# Patient Record
Sex: Female | Born: 1945
Health system: Southern US, Community
[De-identification: ages and names within clinical notes are randomized; demographics above are authoritative.]

## PROBLEM LIST (undated history)

## (undated) HISTORY — PX: CHOLECYSTECTOMY: SHX55

---

## 2007-09-25 DIAGNOSIS — C4491 Basal cell carcinoma of skin, unspecified: Secondary | ICD-10-CM

## 2007-09-25 HISTORY — DX: Basal cell carcinoma of skin, unspecified: C44.91

## 2009-04-19 ENCOUNTER — Emergency Department: Payer: Self-pay | Admitting: Emergency Medicine

## 2009-09-01 ENCOUNTER — Emergency Department: Payer: Self-pay | Admitting: Emergency Medicine

## 2013-08-05 DIAGNOSIS — K5730 Diverticulosis of large intestine without perforation or abscess without bleeding: Secondary | ICD-10-CM | POA: Diagnosis not present

## 2013-08-05 DIAGNOSIS — K648 Other hemorrhoids: Secondary | ICD-10-CM | POA: Diagnosis not present

## 2013-08-05 DIAGNOSIS — K644 Residual hemorrhoidal skin tags: Secondary | ICD-10-CM | POA: Diagnosis not present

## 2013-08-05 DIAGNOSIS — K6389 Other specified diseases of intestine: Secondary | ICD-10-CM | POA: Diagnosis not present

## 2013-08-05 DIAGNOSIS — E785 Hyperlipidemia, unspecified: Secondary | ICD-10-CM | POA: Diagnosis not present

## 2013-08-05 DIAGNOSIS — Z1211 Encounter for screening for malignant neoplasm of colon: Secondary | ICD-10-CM | POA: Diagnosis not present

## 2013-09-02 DIAGNOSIS — L919 Hypertrophic disorder of the skin, unspecified: Secondary | ICD-10-CM | POA: Diagnosis not present

## 2013-09-02 DIAGNOSIS — L578 Other skin changes due to chronic exposure to nonionizing radiation: Secondary | ICD-10-CM | POA: Diagnosis not present

## 2013-09-02 DIAGNOSIS — L909 Atrophic disorder of skin, unspecified: Secondary | ICD-10-CM | POA: Diagnosis not present

## 2013-09-02 DIAGNOSIS — Z85828 Personal history of other malignant neoplasm of skin: Secondary | ICD-10-CM | POA: Diagnosis not present

## 2013-09-02 DIAGNOSIS — L821 Other seborrheic keratosis: Secondary | ICD-10-CM | POA: Diagnosis not present

## 2013-09-02 DIAGNOSIS — L820 Inflamed seborrheic keratosis: Secondary | ICD-10-CM | POA: Diagnosis not present

## 2013-09-15 DIAGNOSIS — H2510 Age-related nuclear cataract, unspecified eye: Secondary | ICD-10-CM | POA: Diagnosis not present

## 2013-10-09 DIAGNOSIS — H6120 Impacted cerumen, unspecified ear: Secondary | ICD-10-CM | POA: Diagnosis not present

## 2013-12-11 ENCOUNTER — Ambulatory Visit: Payer: Self-pay

## 2013-12-11 DIAGNOSIS — Z7982 Long term (current) use of aspirin: Secondary | ICD-10-CM | POA: Diagnosis not present

## 2013-12-11 DIAGNOSIS — S8263XA Displaced fracture of lateral malleolus of unspecified fibula, initial encounter for closed fracture: Secondary | ICD-10-CM | POA: Diagnosis not present

## 2013-12-11 DIAGNOSIS — S82843A Displaced bimalleolar fracture of unspecified lower leg, initial encounter for closed fracture: Secondary | ICD-10-CM | POA: Diagnosis not present

## 2013-12-11 DIAGNOSIS — E7800 Pure hypercholesterolemia, unspecified: Secondary | ICD-10-CM | POA: Diagnosis not present

## 2013-12-12 DIAGNOSIS — X500XXA Overexertion from strenuous movement or load, initial encounter: Secondary | ICD-10-CM | POA: Diagnosis not present

## 2013-12-12 DIAGNOSIS — X503XXA Overexertion from repetitive movements, initial encounter: Secondary | ICD-10-CM | POA: Diagnosis not present

## 2013-12-12 DIAGNOSIS — S8263XA Displaced fracture of lateral malleolus of unspecified fibula, initial encounter for closed fracture: Secondary | ICD-10-CM | POA: Diagnosis not present

## 2014-01-02 DIAGNOSIS — S82899A Other fracture of unspecified lower leg, initial encounter for closed fracture: Secondary | ICD-10-CM | POA: Diagnosis not present

## 2014-01-12 DIAGNOSIS — S8263XA Displaced fracture of lateral malleolus of unspecified fibula, initial encounter for closed fracture: Secondary | ICD-10-CM | POA: Diagnosis not present

## 2014-01-13 DIAGNOSIS — S8263XA Displaced fracture of lateral malleolus of unspecified fibula, initial encounter for closed fracture: Secondary | ICD-10-CM | POA: Diagnosis not present

## 2014-01-16 DIAGNOSIS — S8263XA Displaced fracture of lateral malleolus of unspecified fibula, initial encounter for closed fracture: Secondary | ICD-10-CM | POA: Diagnosis not present

## 2014-01-19 DIAGNOSIS — S8263XA Displaced fracture of lateral malleolus of unspecified fibula, initial encounter for closed fracture: Secondary | ICD-10-CM | POA: Diagnosis not present

## 2014-01-22 DIAGNOSIS — S8263XA Displaced fracture of lateral malleolus of unspecified fibula, initial encounter for closed fracture: Secondary | ICD-10-CM | POA: Diagnosis not present

## 2014-01-26 DIAGNOSIS — S8263XA Displaced fracture of lateral malleolus of unspecified fibula, initial encounter for closed fracture: Secondary | ICD-10-CM | POA: Diagnosis not present

## 2014-01-28 DIAGNOSIS — S8263XA Displaced fracture of lateral malleolus of unspecified fibula, initial encounter for closed fracture: Secondary | ICD-10-CM | POA: Diagnosis not present

## 2014-01-29 DIAGNOSIS — M25579 Pain in unspecified ankle and joints of unspecified foot: Secondary | ICD-10-CM | POA: Diagnosis not present

## 2014-02-02 DIAGNOSIS — S8263XA Displaced fracture of lateral malleolus of unspecified fibula, initial encounter for closed fracture: Secondary | ICD-10-CM | POA: Diagnosis not present

## 2014-02-04 DIAGNOSIS — S8263XA Displaced fracture of lateral malleolus of unspecified fibula, initial encounter for closed fracture: Secondary | ICD-10-CM | POA: Diagnosis not present

## 2014-02-06 DIAGNOSIS — S8263XA Displaced fracture of lateral malleolus of unspecified fibula, initial encounter for closed fracture: Secondary | ICD-10-CM | POA: Diagnosis not present

## 2014-02-12 DIAGNOSIS — S8263XA Displaced fracture of lateral malleolus of unspecified fibula, initial encounter for closed fracture: Secondary | ICD-10-CM | POA: Diagnosis not present

## 2014-02-13 DIAGNOSIS — S8263XA Displaced fracture of lateral malleolus of unspecified fibula, initial encounter for closed fracture: Secondary | ICD-10-CM | POA: Diagnosis not present

## 2014-02-21 DIAGNOSIS — Z1231 Encounter for screening mammogram for malignant neoplasm of breast: Secondary | ICD-10-CM | POA: Diagnosis not present

## 2014-03-24 DIAGNOSIS — Z1239 Encounter for other screening for malignant neoplasm of breast: Secondary | ICD-10-CM | POA: Diagnosis not present

## 2014-03-24 DIAGNOSIS — N952 Postmenopausal atrophic vaginitis: Secondary | ICD-10-CM | POA: Diagnosis not present

## 2014-03-24 DIAGNOSIS — N368 Other specified disorders of urethra: Secondary | ICD-10-CM | POA: Diagnosis not present

## 2014-04-20 DIAGNOSIS — H93299 Other abnormal auditory perceptions, unspecified ear: Secondary | ICD-10-CM | POA: Diagnosis not present

## 2014-04-20 DIAGNOSIS — H6120 Impacted cerumen, unspecified ear: Secondary | ICD-10-CM | POA: Diagnosis not present

## 2014-04-20 DIAGNOSIS — E8881 Metabolic syndrome: Secondary | ICD-10-CM | POA: Diagnosis not present

## 2014-04-20 DIAGNOSIS — Z0000 Encounter for general adult medical examination without abnormal findings: Secondary | ICD-10-CM | POA: Diagnosis not present

## 2014-04-20 DIAGNOSIS — F411 Generalized anxiety disorder: Secondary | ICD-10-CM | POA: Diagnosis not present

## 2014-11-17 DIAGNOSIS — E8881 Metabolic syndrome: Secondary | ICD-10-CM | POA: Diagnosis not present

## 2014-11-17 DIAGNOSIS — I2510 Atherosclerotic heart disease of native coronary artery without angina pectoris: Secondary | ICD-10-CM | POA: Diagnosis not present

## 2014-11-17 DIAGNOSIS — F064 Anxiety disorder due to known physiological condition: Secondary | ICD-10-CM | POA: Diagnosis not present

## 2014-11-20 DIAGNOSIS — H6123 Impacted cerumen, bilateral: Secondary | ICD-10-CM | POA: Diagnosis not present

## 2014-11-20 DIAGNOSIS — H93299 Other abnormal auditory perceptions, unspecified ear: Secondary | ICD-10-CM | POA: Diagnosis not present

## 2014-11-20 DIAGNOSIS — J340 Abscess, furuncle and carbuncle of nose: Secondary | ICD-10-CM | POA: Diagnosis not present

## 2014-12-25 DIAGNOSIS — N649 Disorder of breast, unspecified: Secondary | ICD-10-CM | POA: Diagnosis not present

## 2014-12-25 DIAGNOSIS — N6032 Fibrosclerosis of left breast: Secondary | ICD-10-CM | POA: Diagnosis not present

## 2014-12-29 DIAGNOSIS — N63 Unspecified lump in breast: Secondary | ICD-10-CM | POA: Diagnosis not present

## 2015-03-20 DIAGNOSIS — Z1231 Encounter for screening mammogram for malignant neoplasm of breast: Secondary | ICD-10-CM | POA: Diagnosis not present

## 2015-05-27 DIAGNOSIS — I781 Nevus, non-neoplastic: Secondary | ICD-10-CM | POA: Diagnosis not present

## 2015-05-27 DIAGNOSIS — L918 Other hypertrophic disorders of the skin: Secondary | ICD-10-CM | POA: Diagnosis not present

## 2015-05-27 DIAGNOSIS — D485 Neoplasm of uncertain behavior of skin: Secondary | ICD-10-CM | POA: Diagnosis not present

## 2015-05-27 DIAGNOSIS — Z1283 Encounter for screening for malignant neoplasm of skin: Secondary | ICD-10-CM | POA: Diagnosis not present

## 2015-05-27 DIAGNOSIS — L821 Other seborrheic keratosis: Secondary | ICD-10-CM | POA: Diagnosis not present

## 2015-05-27 DIAGNOSIS — D1800 Hemangioma unspecified site: Secondary | ICD-10-CM | POA: Diagnosis not present

## 2015-05-27 DIAGNOSIS — L820 Inflamed seborrheic keratosis: Secondary | ICD-10-CM | POA: Diagnosis not present

## 2015-05-27 DIAGNOSIS — D229 Melanocytic nevi, unspecified: Secondary | ICD-10-CM | POA: Diagnosis not present

## 2015-05-27 DIAGNOSIS — Z85828 Personal history of other malignant neoplasm of skin: Secondary | ICD-10-CM | POA: Diagnosis not present

## 2015-05-28 DIAGNOSIS — H6123 Impacted cerumen, bilateral: Secondary | ICD-10-CM | POA: Diagnosis not present

## 2015-06-14 DIAGNOSIS — R079 Chest pain, unspecified: Secondary | ICD-10-CM | POA: Diagnosis not present

## 2015-06-14 DIAGNOSIS — F064 Anxiety disorder due to known physiological condition: Secondary | ICD-10-CM | POA: Diagnosis not present

## 2015-06-14 DIAGNOSIS — R42 Dizziness and giddiness: Secondary | ICD-10-CM | POA: Diagnosis not present

## 2015-08-26 IMAGING — CR DG ANKLE COMPLETE 3+V*L*
1 series · 3 of 3 positions shown · non-contrast
Comparison: None.

CLINICAL DATA: Fall, ankle twisting.

EXAM:
LEFT ANKLE COMPLETE - 3+ VIEW

[Series 1: ap · 0.17mm/px · 3 of 3 slices shown]
[im 1/3]
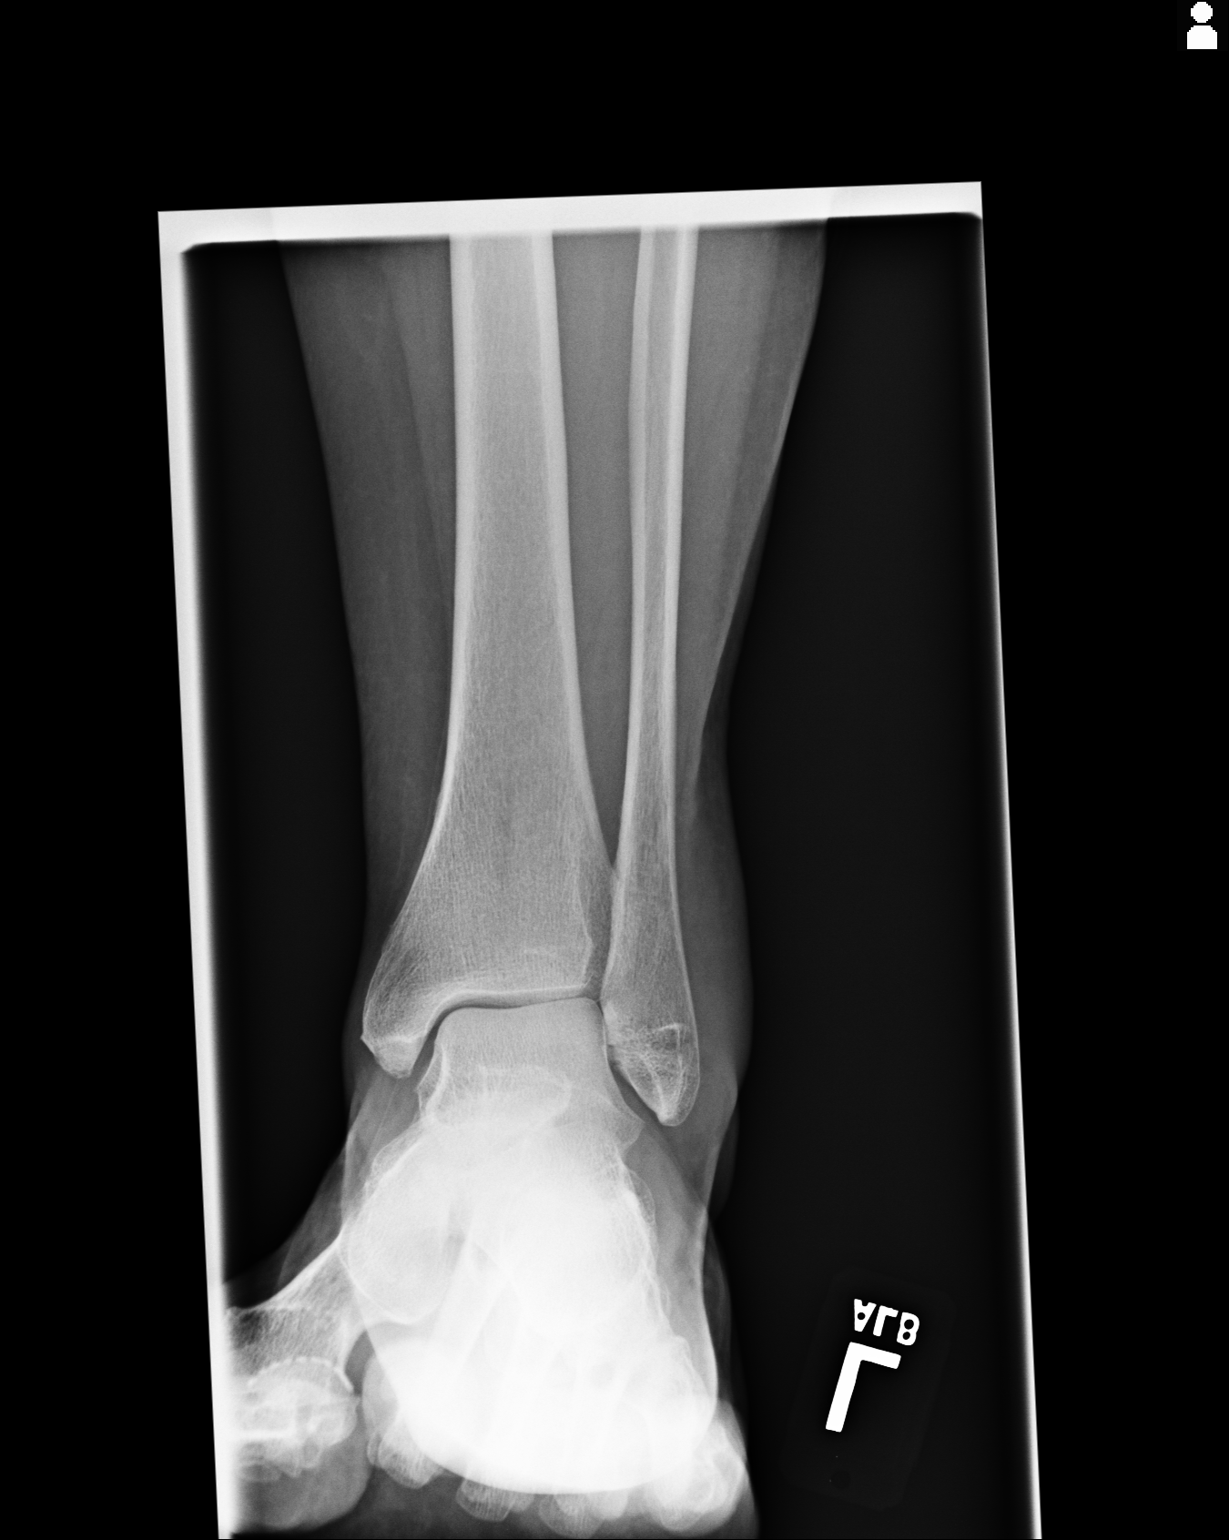
[im 2/3]
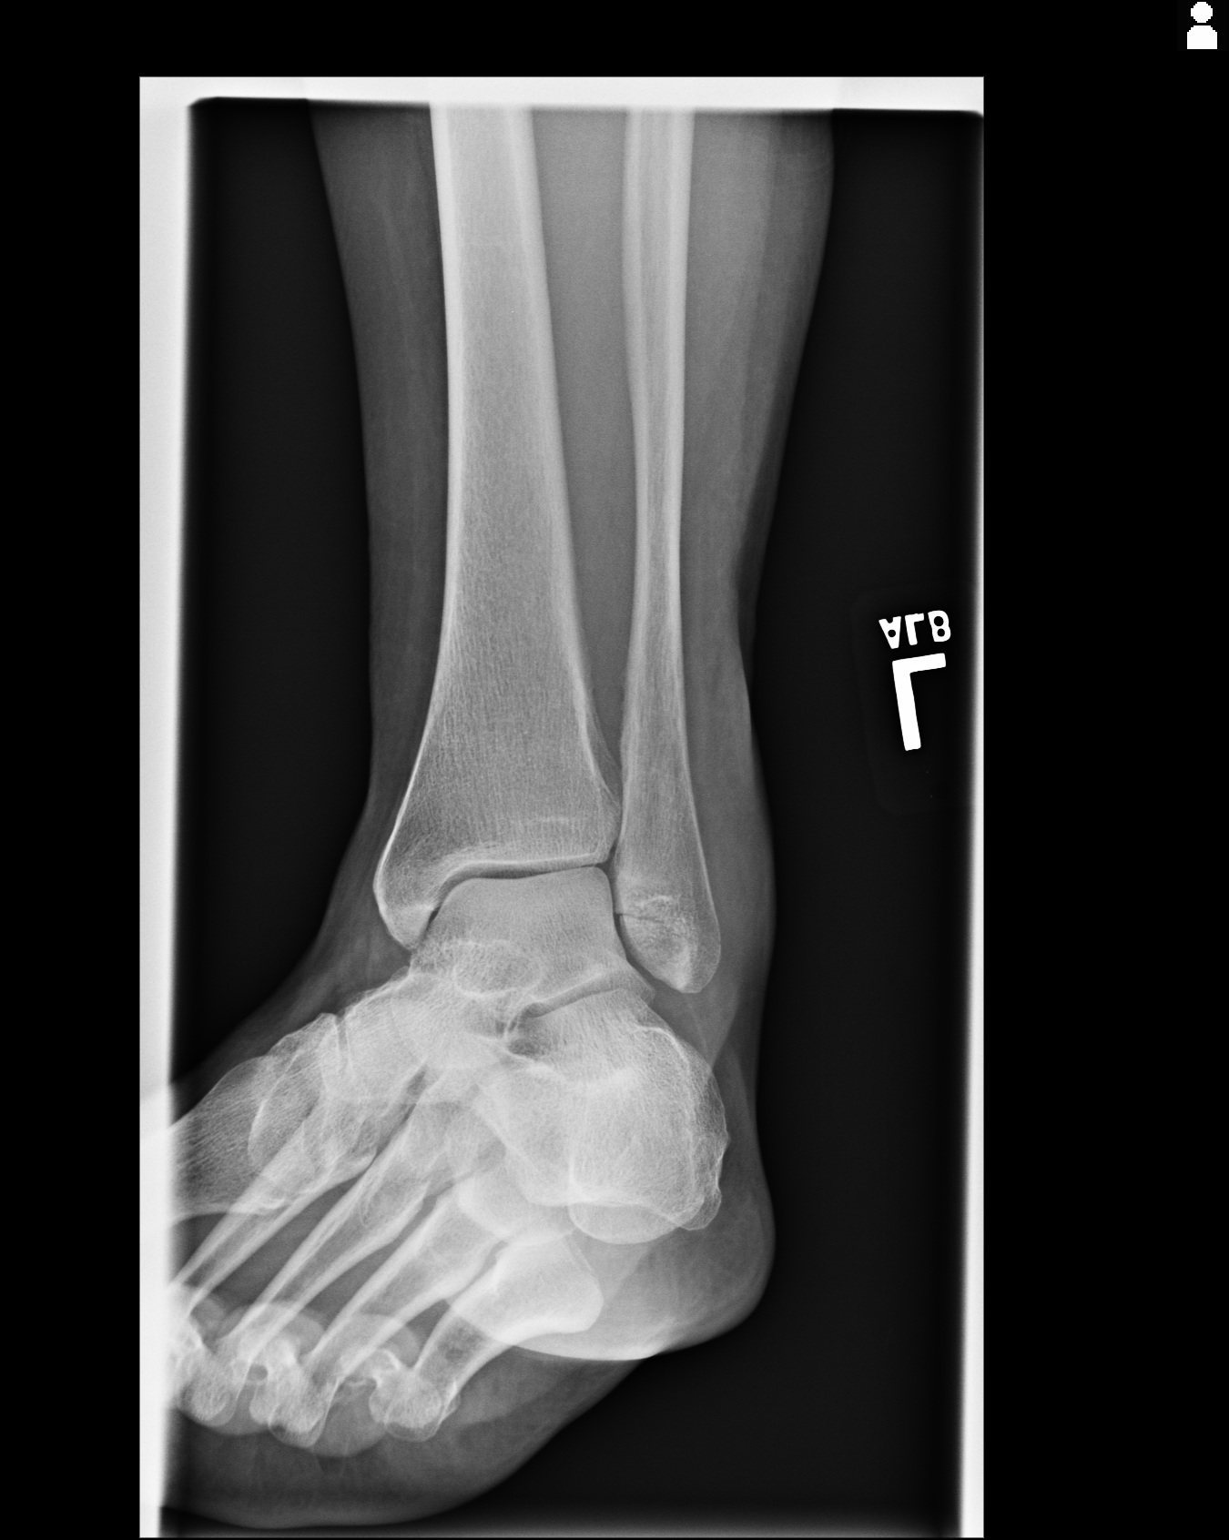
[im 3/3]
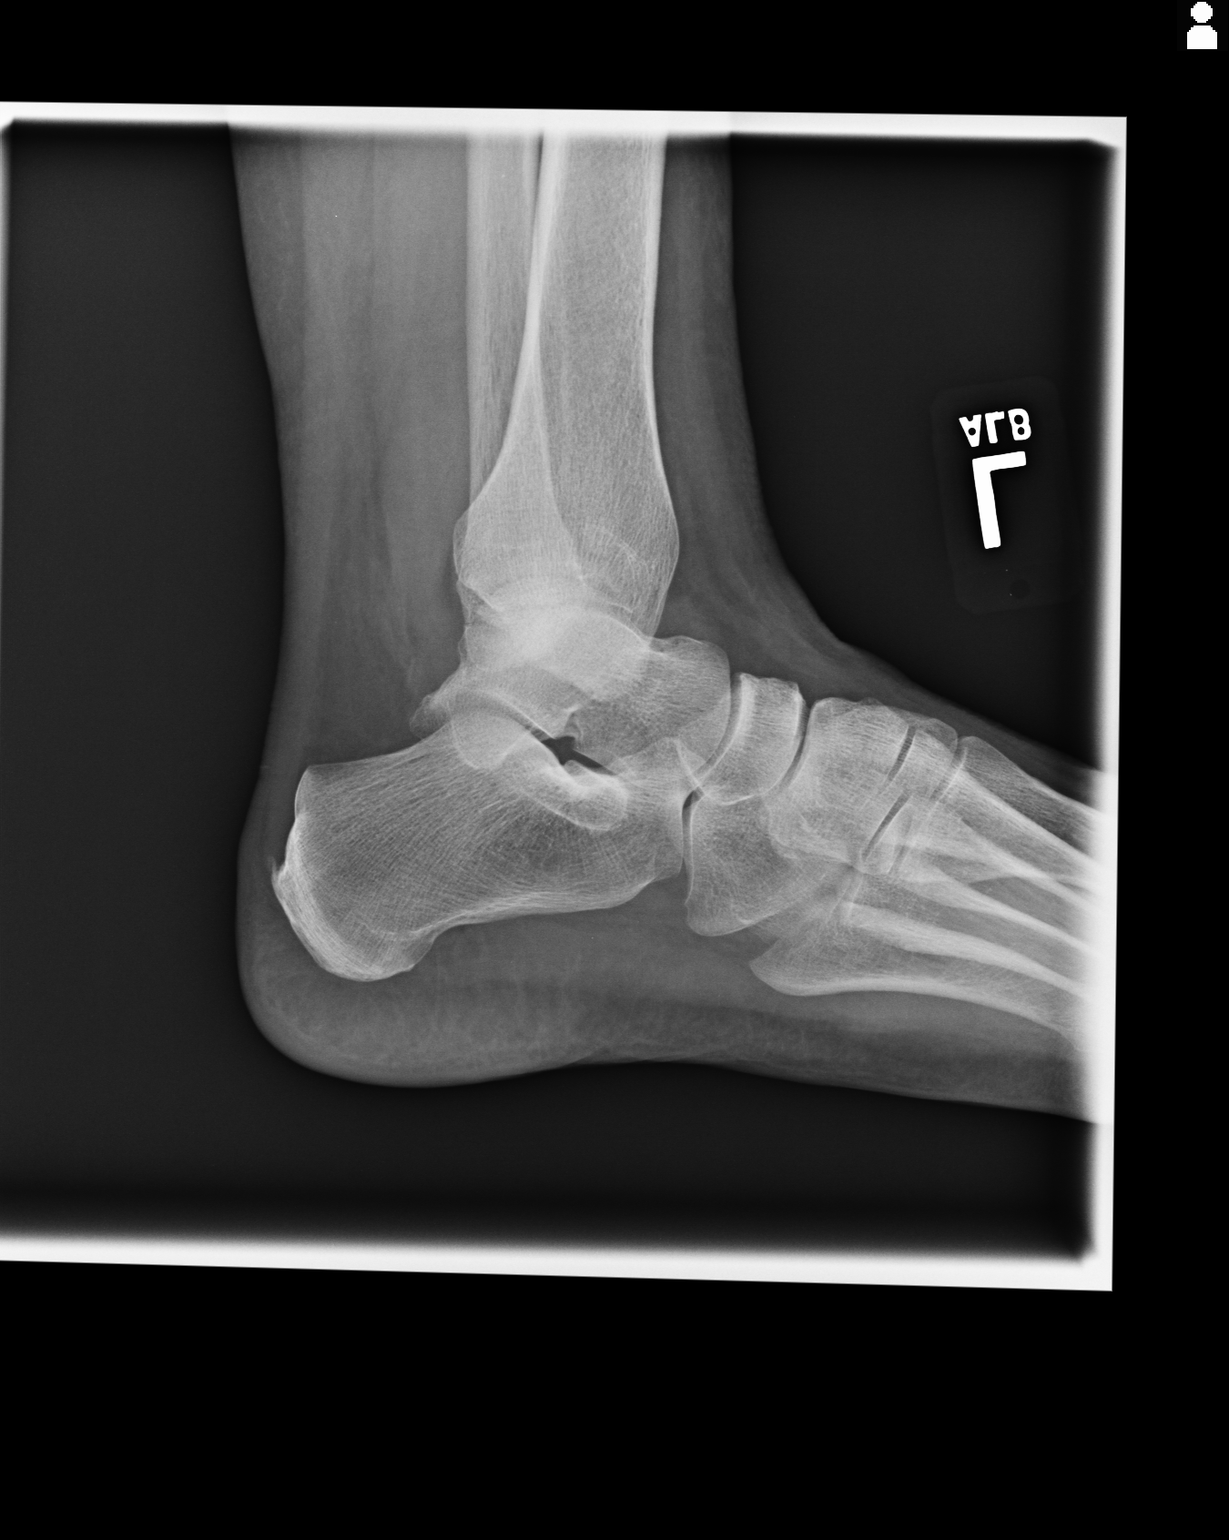

[3 of 3 positions shown; findings below may reference images not displayed]

FINDINGS: Nondisplaced lateral malleolus fracture without dislocation. Ankle
mortise appears congruent and tibial fibular syndesmosis intact. No
destructive bony lesions. Lateral ankle soft tissue swelling without
subcutaneous gas or radiopaque foreign bodies.
IMPRESSION: Nondisplaced lateral malleolus fracture without dislocation.

  By: Ferienhaus Erxleben

## 2015-11-10 DIAGNOSIS — L578 Other skin changes due to chronic exposure to nonionizing radiation: Secondary | ICD-10-CM | POA: Diagnosis not present

## 2015-11-10 DIAGNOSIS — L820 Inflamed seborrheic keratosis: Secondary | ICD-10-CM | POA: Diagnosis not present

## 2015-11-10 DIAGNOSIS — D1800 Hemangioma unspecified site: Secondary | ICD-10-CM | POA: Diagnosis not present

## 2015-11-10 DIAGNOSIS — L821 Other seborrheic keratosis: Secondary | ICD-10-CM | POA: Diagnosis not present

## 2015-11-10 DIAGNOSIS — T07 Unspecified multiple injuries: Secondary | ICD-10-CM | POA: Diagnosis not present

## 2015-12-01 DIAGNOSIS — J301 Allergic rhinitis due to pollen: Secondary | ICD-10-CM | POA: Diagnosis not present

## 2015-12-01 DIAGNOSIS — H6123 Impacted cerumen, bilateral: Secondary | ICD-10-CM | POA: Diagnosis not present

## 2015-12-14 DIAGNOSIS — E8881 Metabolic syndrome: Secondary | ICD-10-CM | POA: Diagnosis not present

## 2015-12-14 DIAGNOSIS — F064 Anxiety disorder due to known physiological condition: Secondary | ICD-10-CM | POA: Diagnosis not present

## 2015-12-23 DIAGNOSIS — D1800 Hemangioma unspecified site: Secondary | ICD-10-CM | POA: Diagnosis not present

## 2015-12-23 DIAGNOSIS — D229 Melanocytic nevi, unspecified: Secondary | ICD-10-CM | POA: Diagnosis not present

## 2015-12-23 DIAGNOSIS — L820 Inflamed seborrheic keratosis: Secondary | ICD-10-CM | POA: Diagnosis not present

## 2015-12-23 DIAGNOSIS — L981 Factitial dermatitis: Secondary | ICD-10-CM | POA: Diagnosis not present

## 2015-12-24 DIAGNOSIS — F064 Anxiety disorder due to known physiological condition: Secondary | ICD-10-CM | POA: Diagnosis not present

## 2015-12-24 DIAGNOSIS — E8881 Metabolic syndrome: Secondary | ICD-10-CM | POA: Diagnosis not present

## 2016-03-25 DIAGNOSIS — Z1231 Encounter for screening mammogram for malignant neoplasm of breast: Secondary | ICD-10-CM | POA: Diagnosis not present

## 2016-05-25 DIAGNOSIS — Z01419 Encounter for gynecological examination (general) (routine) without abnormal findings: Secondary | ICD-10-CM | POA: Diagnosis not present

## 2016-06-05 DIAGNOSIS — H6980 Other specified disorders of Eustachian tube, unspecified ear: Secondary | ICD-10-CM | POA: Diagnosis not present

## 2016-06-05 DIAGNOSIS — J301 Allergic rhinitis due to pollen: Secondary | ICD-10-CM | POA: Diagnosis not present

## 2016-09-19 DIAGNOSIS — E8881 Metabolic syndrome: Secondary | ICD-10-CM | POA: Diagnosis not present

## 2016-09-19 DIAGNOSIS — F064 Anxiety disorder due to known physiological condition: Secondary | ICD-10-CM | POA: Diagnosis not present

## 2016-12-04 DIAGNOSIS — H6123 Impacted cerumen, bilateral: Secondary | ICD-10-CM | POA: Diagnosis not present

## 2016-12-04 DIAGNOSIS — Z411 Encounter for cosmetic surgery: Secondary | ICD-10-CM | POA: Diagnosis not present

## 2016-12-04 DIAGNOSIS — H9193 Unspecified hearing loss, bilateral: Secondary | ICD-10-CM | POA: Diagnosis not present

## 2016-12-22 DIAGNOSIS — Z85828 Personal history of other malignant neoplasm of skin: Secondary | ICD-10-CM | POA: Diagnosis not present

## 2016-12-22 DIAGNOSIS — I781 Nevus, non-neoplastic: Secondary | ICD-10-CM | POA: Diagnosis not present

## 2016-12-22 DIAGNOSIS — D229 Melanocytic nevi, unspecified: Secondary | ICD-10-CM | POA: Diagnosis not present

## 2016-12-22 DIAGNOSIS — Z1283 Encounter for screening for malignant neoplasm of skin: Secondary | ICD-10-CM | POA: Diagnosis not present

## 2016-12-22 DIAGNOSIS — L821 Other seborrheic keratosis: Secondary | ICD-10-CM | POA: Diagnosis not present

## 2016-12-22 DIAGNOSIS — I8393 Asymptomatic varicose veins of bilateral lower extremities: Secondary | ICD-10-CM | POA: Diagnosis not present

## 2016-12-22 DIAGNOSIS — L812 Freckles: Secondary | ICD-10-CM | POA: Diagnosis not present

## 2016-12-22 DIAGNOSIS — D1800 Hemangioma unspecified site: Secondary | ICD-10-CM | POA: Diagnosis not present

## 2016-12-22 DIAGNOSIS — L578 Other skin changes due to chronic exposure to nonionizing radiation: Secondary | ICD-10-CM | POA: Diagnosis not present

## 2016-12-22 DIAGNOSIS — L820 Inflamed seborrheic keratosis: Secondary | ICD-10-CM | POA: Diagnosis not present

## 2017-04-07 DIAGNOSIS — Z1231 Encounter for screening mammogram for malignant neoplasm of breast: Secondary | ICD-10-CM | POA: Diagnosis not present

## 2017-04-09 DIAGNOSIS — H6122 Impacted cerumen, left ear: Secondary | ICD-10-CM | POA: Diagnosis not present

## 2017-05-16 DIAGNOSIS — E8881 Metabolic syndrome: Secondary | ICD-10-CM | POA: Diagnosis not present

## 2017-05-16 DIAGNOSIS — F064 Anxiety disorder due to known physiological condition: Secondary | ICD-10-CM | POA: Diagnosis not present

## 2017-05-16 DIAGNOSIS — E639 Nutritional deficiency, unspecified: Secondary | ICD-10-CM | POA: Diagnosis not present

## 2017-05-16 DIAGNOSIS — E889 Metabolic disorder, unspecified: Secondary | ICD-10-CM | POA: Diagnosis not present

## 2017-08-09 DIAGNOSIS — H6123 Impacted cerumen, bilateral: Secondary | ICD-10-CM | POA: Diagnosis not present

## 2017-11-06 DIAGNOSIS — K210 Gastro-esophageal reflux disease with esophagitis: Secondary | ICD-10-CM | POA: Diagnosis not present

## 2017-11-06 DIAGNOSIS — E8881 Metabolic syndrome: Secondary | ICD-10-CM | POA: Diagnosis not present

## 2017-11-06 DIAGNOSIS — F064 Anxiety disorder due to known physiological condition: Secondary | ICD-10-CM | POA: Diagnosis not present

## 2017-11-06 DIAGNOSIS — F419 Anxiety disorder, unspecified: Secondary | ICD-10-CM | POA: Diagnosis not present

## 2017-11-13 DIAGNOSIS — R0602 Shortness of breath: Secondary | ICD-10-CM | POA: Diagnosis not present

## 2017-11-13 DIAGNOSIS — Z79899 Other long term (current) drug therapy: Secondary | ICD-10-CM | POA: Diagnosis not present

## 2017-11-13 DIAGNOSIS — Z8249 Family history of ischemic heart disease and other diseases of the circulatory system: Secondary | ICD-10-CM | POA: Diagnosis not present

## 2017-11-13 DIAGNOSIS — M791 Myalgia, unspecified site: Secondary | ICD-10-CM | POA: Diagnosis not present

## 2017-11-13 DIAGNOSIS — R0789 Other chest pain: Secondary | ICD-10-CM | POA: Diagnosis not present

## 2017-11-13 DIAGNOSIS — Z7982 Long term (current) use of aspirin: Secondary | ICD-10-CM | POA: Diagnosis not present

## 2017-11-13 DIAGNOSIS — Z5181 Encounter for therapeutic drug level monitoring: Secondary | ICD-10-CM | POA: Diagnosis not present

## 2017-11-13 DIAGNOSIS — R6884 Jaw pain: Secondary | ICD-10-CM | POA: Diagnosis not present

## 2017-11-19 DIAGNOSIS — R0789 Other chest pain: Secondary | ICD-10-CM | POA: Diagnosis not present

## 2017-11-22 DIAGNOSIS — R0789 Other chest pain: Secondary | ICD-10-CM | POA: Diagnosis not present

## 2017-12-26 DIAGNOSIS — I8393 Asymptomatic varicose veins of bilateral lower extremities: Secondary | ICD-10-CM | POA: Diagnosis not present

## 2017-12-26 DIAGNOSIS — Z1283 Encounter for screening for malignant neoplasm of skin: Secondary | ICD-10-CM | POA: Diagnosis not present

## 2017-12-26 DIAGNOSIS — L578 Other skin changes due to chronic exposure to nonionizing radiation: Secondary | ICD-10-CM | POA: Diagnosis not present

## 2017-12-26 DIAGNOSIS — L812 Freckles: Secondary | ICD-10-CM | POA: Diagnosis not present

## 2017-12-26 DIAGNOSIS — D1800 Hemangioma unspecified site: Secondary | ICD-10-CM | POA: Diagnosis not present

## 2017-12-26 DIAGNOSIS — D229 Melanocytic nevi, unspecified: Secondary | ICD-10-CM | POA: Diagnosis not present

## 2017-12-26 DIAGNOSIS — L821 Other seborrheic keratosis: Secondary | ICD-10-CM | POA: Diagnosis not present

## 2017-12-26 DIAGNOSIS — L720 Epidermal cyst: Secondary | ICD-10-CM | POA: Diagnosis not present

## 2017-12-26 DIAGNOSIS — L918 Other hypertrophic disorders of the skin: Secondary | ICD-10-CM | POA: Diagnosis not present

## 2017-12-26 DIAGNOSIS — I781 Nevus, non-neoplastic: Secondary | ICD-10-CM | POA: Diagnosis not present

## 2017-12-26 DIAGNOSIS — Z85828 Personal history of other malignant neoplasm of skin: Secondary | ICD-10-CM | POA: Diagnosis not present

## 2018-01-03 DIAGNOSIS — H6123 Impacted cerumen, bilateral: Secondary | ICD-10-CM | POA: Diagnosis not present

## 2018-03-11 DIAGNOSIS — H2513 Age-related nuclear cataract, bilateral: Secondary | ICD-10-CM | POA: Diagnosis not present

## 2018-03-11 DIAGNOSIS — H02834 Dermatochalasis of left upper eyelid: Secondary | ICD-10-CM | POA: Diagnosis not present

## 2018-03-11 DIAGNOSIS — H02831 Dermatochalasis of right upper eyelid: Secondary | ICD-10-CM | POA: Diagnosis not present

## 2018-04-09 DIAGNOSIS — D485 Neoplasm of uncertain behavior of skin: Secondary | ICD-10-CM | POA: Diagnosis not present

## 2018-04-09 DIAGNOSIS — L720 Epidermal cyst: Secondary | ICD-10-CM | POA: Diagnosis not present

## 2018-04-16 DIAGNOSIS — L720 Epidermal cyst: Secondary | ICD-10-CM | POA: Diagnosis not present

## 2018-05-01 DIAGNOSIS — Z0000 Encounter for general adult medical examination without abnormal findings: Secondary | ICD-10-CM | POA: Diagnosis not present

## 2018-05-01 DIAGNOSIS — E8881 Metabolic syndrome: Secondary | ICD-10-CM | POA: Diagnosis not present

## 2018-05-01 DIAGNOSIS — K210 Gastro-esophageal reflux disease with esophagitis: Secondary | ICD-10-CM | POA: Diagnosis not present

## 2018-05-01 DIAGNOSIS — F419 Anxiety disorder, unspecified: Secondary | ICD-10-CM | POA: Diagnosis not present

## 2018-05-01 DIAGNOSIS — F064 Anxiety disorder due to known physiological condition: Secondary | ICD-10-CM | POA: Diagnosis not present

## 2018-05-02 DIAGNOSIS — E7849 Other hyperlipidemia: Secondary | ICD-10-CM | POA: Diagnosis not present

## 2018-05-02 DIAGNOSIS — I10 Essential (primary) hypertension: Secondary | ICD-10-CM | POA: Diagnosis not present

## 2018-05-02 DIAGNOSIS — E119 Type 2 diabetes mellitus without complications: Secondary | ICD-10-CM | POA: Diagnosis not present

## 2018-05-02 DIAGNOSIS — R5381 Other malaise: Secondary | ICD-10-CM | POA: Diagnosis not present

## 2018-05-06 DIAGNOSIS — H6123 Impacted cerumen, bilateral: Secondary | ICD-10-CM | POA: Diagnosis not present

## 2018-05-06 DIAGNOSIS — J301 Allergic rhinitis due to pollen: Secondary | ICD-10-CM | POA: Diagnosis not present

## 2018-05-11 DIAGNOSIS — Z1231 Encounter for screening mammogram for malignant neoplasm of breast: Secondary | ICD-10-CM | POA: Diagnosis not present

## 2018-06-14 ENCOUNTER — Other Ambulatory Visit: Payer: Self-pay

## 2018-08-22 DIAGNOSIS — H02831 Dermatochalasis of right upper eyelid: Secondary | ICD-10-CM | POA: Diagnosis not present

## 2018-08-22 DIAGNOSIS — R238 Other skin changes: Secondary | ICD-10-CM | POA: Diagnosis not present

## 2018-08-22 DIAGNOSIS — H02834 Dermatochalasis of left upper eyelid: Secondary | ICD-10-CM | POA: Diagnosis not present

## 2018-08-22 DIAGNOSIS — H02832 Dermatochalasis of right lower eyelid: Secondary | ICD-10-CM | POA: Diagnosis not present

## 2018-08-22 DIAGNOSIS — H02423 Myogenic ptosis of bilateral eyelids: Secondary | ICD-10-CM | POA: Diagnosis not present

## 2018-08-22 DIAGNOSIS — D1801 Hemangioma of skin and subcutaneous tissue: Secondary | ICD-10-CM | POA: Diagnosis not present

## 2018-08-22 DIAGNOSIS — H02835 Dermatochalasis of left lower eyelid: Secondary | ICD-10-CM | POA: Diagnosis not present

## 2018-10-01 DIAGNOSIS — F064 Anxiety disorder due to known physiological condition: Secondary | ICD-10-CM | POA: Diagnosis not present

## 2018-10-01 DIAGNOSIS — I2510 Atherosclerotic heart disease of native coronary artery without angina pectoris: Secondary | ICD-10-CM | POA: Diagnosis not present

## 2018-10-01 DIAGNOSIS — E8881 Metabolic syndrome: Secondary | ICD-10-CM | POA: Diagnosis not present

## 2018-10-02 DIAGNOSIS — G2581 Restless legs syndrome: Secondary | ICD-10-CM | POA: Diagnosis not present

## 2018-10-02 DIAGNOSIS — E611 Iron deficiency: Secondary | ICD-10-CM | POA: Diagnosis not present

## 2018-12-06 DIAGNOSIS — H6123 Impacted cerumen, bilateral: Secondary | ICD-10-CM | POA: Diagnosis not present

## 2018-12-06 DIAGNOSIS — J301 Allergic rhinitis due to pollen: Secondary | ICD-10-CM | POA: Diagnosis not present

## 2019-02-09 DIAGNOSIS — J029 Acute pharyngitis, unspecified: Secondary | ICD-10-CM | POA: Diagnosis not present

## 2019-02-09 DIAGNOSIS — R1030 Lower abdominal pain, unspecified: Secondary | ICD-10-CM | POA: Diagnosis not present

## 2019-02-09 DIAGNOSIS — D72829 Elevated white blood cell count, unspecified: Secondary | ICD-10-CM | POA: Diagnosis not present

## 2019-02-09 DIAGNOSIS — R509 Fever, unspecified: Secondary | ICD-10-CM | POA: Diagnosis not present

## 2019-02-09 DIAGNOSIS — Z20828 Contact with and (suspected) exposure to other viral communicable diseases: Secondary | ICD-10-CM | POA: Diagnosis not present

## 2019-02-09 DIAGNOSIS — R109 Unspecified abdominal pain: Secondary | ICD-10-CM | POA: Diagnosis not present

## 2019-02-09 DIAGNOSIS — R05 Cough: Secondary | ICD-10-CM | POA: Diagnosis not present

## 2019-02-09 DIAGNOSIS — R51 Headache: Secondary | ICD-10-CM | POA: Diagnosis not present

## 2019-03-25 DIAGNOSIS — L821 Other seborrheic keratosis: Secondary | ICD-10-CM | POA: Diagnosis not present

## 2019-03-25 DIAGNOSIS — D225 Melanocytic nevi of trunk: Secondary | ICD-10-CM | POA: Diagnosis not present

## 2019-03-25 DIAGNOSIS — I781 Nevus, non-neoplastic: Secondary | ICD-10-CM | POA: Diagnosis not present

## 2019-03-25 DIAGNOSIS — I878 Other specified disorders of veins: Secondary | ICD-10-CM | POA: Diagnosis not present

## 2019-03-25 DIAGNOSIS — L578 Other skin changes due to chronic exposure to nonionizing radiation: Secondary | ICD-10-CM | POA: Diagnosis not present

## 2019-03-25 DIAGNOSIS — L918 Other hypertrophic disorders of the skin: Secondary | ICD-10-CM | POA: Diagnosis not present

## 2019-03-25 DIAGNOSIS — Z85828 Personal history of other malignant neoplasm of skin: Secondary | ICD-10-CM | POA: Diagnosis not present

## 2019-03-25 DIAGNOSIS — L820 Inflamed seborrheic keratosis: Secondary | ICD-10-CM | POA: Diagnosis not present

## 2019-03-25 DIAGNOSIS — D1800 Hemangioma unspecified site: Secondary | ICD-10-CM | POA: Diagnosis not present

## 2019-03-25 DIAGNOSIS — Z1283 Encounter for screening for malignant neoplasm of skin: Secondary | ICD-10-CM | POA: Diagnosis not present

## 2019-04-12 DIAGNOSIS — Z01818 Encounter for other preprocedural examination: Secondary | ICD-10-CM | POA: Diagnosis not present

## 2019-04-12 DIAGNOSIS — Z20828 Contact with and (suspected) exposure to other viral communicable diseases: Secondary | ICD-10-CM | POA: Diagnosis not present

## 2019-04-15 DIAGNOSIS — H02831 Dermatochalasis of right upper eyelid: Secondary | ICD-10-CM | POA: Diagnosis not present

## 2019-04-15 DIAGNOSIS — D1801 Hemangioma of skin and subcutaneous tissue: Secondary | ICD-10-CM | POA: Diagnosis not present

## 2019-04-15 DIAGNOSIS — D2339 Other benign neoplasm of skin of other parts of face: Secondary | ICD-10-CM | POA: Diagnosis not present

## 2019-04-15 DIAGNOSIS — H02834 Dermatochalasis of left upper eyelid: Secondary | ICD-10-CM | POA: Diagnosis not present

## 2019-05-13 DIAGNOSIS — I2510 Atherosclerotic heart disease of native coronary artery without angina pectoris: Secondary | ICD-10-CM | POA: Diagnosis not present

## 2019-05-13 DIAGNOSIS — Z2821 Immunization not carried out because of patient refusal: Secondary | ICD-10-CM | POA: Diagnosis not present

## 2019-05-13 DIAGNOSIS — F064 Anxiety disorder due to known physiological condition: Secondary | ICD-10-CM | POA: Diagnosis not present

## 2019-05-13 DIAGNOSIS — E8881 Metabolic syndrome: Secondary | ICD-10-CM | POA: Diagnosis not present

## 2019-06-06 DIAGNOSIS — H6123 Impacted cerumen, bilateral: Secondary | ICD-10-CM | POA: Diagnosis not present

## 2019-06-06 DIAGNOSIS — H93293 Other abnormal auditory perceptions, bilateral: Secondary | ICD-10-CM | POA: Diagnosis not present

## 2019-07-07 DIAGNOSIS — S8002XA Contusion of left knee, initial encounter: Secondary | ICD-10-CM | POA: Diagnosis not present

## 2019-07-28 DIAGNOSIS — R112 Nausea with vomiting, unspecified: Secondary | ICD-10-CM | POA: Diagnosis not present

## 2019-07-28 DIAGNOSIS — N211 Calculus in urethra: Secondary | ICD-10-CM | POA: Diagnosis not present

## 2019-07-28 DIAGNOSIS — N23 Unspecified renal colic: Secondary | ICD-10-CM | POA: Diagnosis not present

## 2019-07-28 DIAGNOSIS — N201 Calculus of ureter: Secondary | ICD-10-CM | POA: Diagnosis not present

## 2019-07-28 DIAGNOSIS — R110 Nausea: Secondary | ICD-10-CM | POA: Diagnosis not present

## 2019-07-30 DIAGNOSIS — N23 Unspecified renal colic: Secondary | ICD-10-CM | POA: Diagnosis not present

## 2019-07-30 DIAGNOSIS — R112 Nausea with vomiting, unspecified: Secondary | ICD-10-CM | POA: Diagnosis not present

## 2019-07-30 DIAGNOSIS — N201 Calculus of ureter: Secondary | ICD-10-CM | POA: Diagnosis not present

## 2019-08-05 DIAGNOSIS — N200 Calculus of kidney: Secondary | ICD-10-CM | POA: Diagnosis not present

## 2019-08-06 DIAGNOSIS — N23 Unspecified renal colic: Secondary | ICD-10-CM | POA: Diagnosis not present

## 2019-08-06 DIAGNOSIS — M5489 Other dorsalgia: Secondary | ICD-10-CM | POA: Diagnosis not present

## 2019-08-06 DIAGNOSIS — N201 Calculus of ureter: Secondary | ICD-10-CM | POA: Diagnosis not present

## 2019-08-09 DIAGNOSIS — Z1231 Encounter for screening mammogram for malignant neoplasm of breast: Secondary | ICD-10-CM | POA: Diagnosis not present

## 2019-08-21 DIAGNOSIS — D259 Leiomyoma of uterus, unspecified: Secondary | ICD-10-CM | POA: Diagnosis not present

## 2019-08-21 DIAGNOSIS — N852 Hypertrophy of uterus: Secondary | ICD-10-CM | POA: Diagnosis not present

## 2019-09-09 DIAGNOSIS — H6120 Impacted cerumen, unspecified ear: Secondary | ICD-10-CM | POA: Diagnosis not present

## 2019-09-09 DIAGNOSIS — F064 Anxiety disorder due to known physiological condition: Secondary | ICD-10-CM | POA: Diagnosis not present

## 2019-09-09 DIAGNOSIS — E8881 Metabolic syndrome: Secondary | ICD-10-CM | POA: Diagnosis not present

## 2019-09-15 DIAGNOSIS — S20212A Contusion of left front wall of thorax, initial encounter: Secondary | ICD-10-CM | POA: Diagnosis not present

## 2019-11-26 ENCOUNTER — Other Ambulatory Visit: Payer: Self-pay

## 2019-11-26 ENCOUNTER — Ambulatory Visit (INDEPENDENT_AMBULATORY_CARE_PROVIDER_SITE_OTHER): Payer: Medicare Other | Admitting: Dermatology

## 2019-11-26 DIAGNOSIS — L821 Other seborrheic keratosis: Secondary | ICD-10-CM

## 2019-11-26 DIAGNOSIS — L578 Other skin changes due to chronic exposure to nonionizing radiation: Secondary | ICD-10-CM

## 2019-11-26 DIAGNOSIS — L820 Inflamed seborrheic keratosis: Secondary | ICD-10-CM | POA: Diagnosis not present

## 2019-11-26 DIAGNOSIS — Z85828 Personal history of other malignant neoplasm of skin: Secondary | ICD-10-CM

## 2019-11-26 NOTE — Patient Instructions (Signed)

## 2019-11-26 NOTE — Progress Notes (Signed)
   Follow-Up Visit   Subjective  Carrie Vargas is a 74 y.o. female who presents for the following: Other (Spot on bilateral leg x few months. Irritating and wants to make sure they are not cancer.).    The following portions of the chart were reviewed this encounter and updated as appropriate:  Allergies  Meds  Problems  Med Hx  Surg Hx  Fam Hx      Review of Systems:  No other skin or systemic complaints except as noted in HPI or Assessment and Plan.  Objective  Well appearing patient in no apparent distress; mood and affect are within normal limits.  A focused examination was performed including legs, arms, chest. Relevant physical exam findings are noted in the Assessment and Plan.  Objective  Right pretibial: Well healed scar with no evidence of recurrence.   Objective  Legs (22): Erythematous keratotic or waxy stuck-on papule or plaque.    Assessment & Plan    Actinic Damage - diffuse scaly erythematous macules with underlying dyspigmentation - Recommend daily broad spectrum sunscreen SPF 30+ to sun-exposed areas, reapply every 2 hours as needed.  - Call for new or changing lesions.  Seborrheic Keratoses - Stuck-on, waxy, tan-brown papules and plaques  - Discussed benign etiology and prognosis. - Observe - Call for any changes   History of basal cell carcinoma (BCC) Right pretibial  Clear today.  Inflamed seborrheic keratosis (22) Legs  Destruction of lesion - Legs Complexity: simple   Destruction method: cryotherapy   Informed consent: discussed and consent obtained   Timeout:  patient name, date of birth, surgical site, and procedure verified Lesion destroyed using liquid nitrogen: Yes   Region frozen until ice ball extended beyond lesion: Yes   Outcome: patient tolerated procedure well with no complications   Post-procedure details: wound care instructions given    Return in about 6 months (around 05/27/2020).   I, Ashok Cordia, CMA, am  acting as scribe for Sarina Ser, MD .   Documentation: I have reviewed the above documentation for accuracy and completeness, and I agree with the above.  Sarina Ser, MD

## 2019-11-28 ENCOUNTER — Encounter: Payer: Self-pay | Admitting: Dermatology

## 2019-12-05 DIAGNOSIS — H6123 Impacted cerumen, bilateral: Secondary | ICD-10-CM | POA: Diagnosis not present

## 2019-12-05 DIAGNOSIS — H6063 Unspecified chronic otitis externa, bilateral: Secondary | ICD-10-CM | POA: Diagnosis not present

## 2020-01-22 DIAGNOSIS — S83232A Complex tear of medial meniscus, current injury, left knee, initial encounter: Secondary | ICD-10-CM | POA: Diagnosis not present

## 2020-01-22 DIAGNOSIS — M25462 Effusion, left knee: Secondary | ICD-10-CM | POA: Diagnosis not present

## 2020-01-22 DIAGNOSIS — M25562 Pain in left knee: Secondary | ICD-10-CM | POA: Diagnosis not present

## 2020-01-22 DIAGNOSIS — X58XXXA Exposure to other specified factors, initial encounter: Secondary | ICD-10-CM | POA: Diagnosis not present

## 2020-02-19 ENCOUNTER — Other Ambulatory Visit: Payer: Self-pay

## 2020-02-19 ENCOUNTER — Ambulatory Visit: Payer: Medicare Other | Admitting: Internal Medicine

## 2020-02-22 ENCOUNTER — Other Ambulatory Visit: Payer: Self-pay

## 2020-02-22 ENCOUNTER — Encounter: Payer: Self-pay | Admitting: Emergency Medicine

## 2020-02-22 ENCOUNTER — Ambulatory Visit
Admission: EM | Admit: 2020-02-22 | Discharge: 2020-02-22 | Disposition: A | Discharge status: Home / Self Care | Payer: Medicare Other | Attending: Family Medicine | Admitting: Family Medicine

## 2020-02-22 DIAGNOSIS — L255 Unspecified contact dermatitis due to plants, except food: Secondary | ICD-10-CM | POA: Diagnosis not present

## 2020-02-22 MED ORDER — HYDROXYZINE HCL 25 MG PO TABS: 25.0000 mg | ORAL_TABLET | Freq: Three times a day (TID) | ORAL | 0 refills | Status: DC | PRN

## 2020-02-22 MED ORDER — METHYLPREDNISOLONE SODIUM SUCC 40 MG IJ SOLR
80.0000 mg | Freq: Once | INTRAMUSCULAR | Status: AC
  Administered 2020-02-22: 80 mg via INTRAMUSCULAR

## 2020-02-22 MED ORDER — PREDNISONE 10 MG PO TABS: ORAL_TABLET | ORAL | 0 refills | Status: DC

## 2020-02-22 NOTE — ED Triage Notes (Signed)
Patient c/o itchy red rash on her arms, neck, legs and stomach that started on Wed.

## 2020-02-22 NOTE — ED Provider Notes (Signed)
[facility]    CSN: [id number] Arrival date & time: 02/22/20  1048  History   Chief Complaint Chief Complaint  Patient presents with   Rash   HPI  74 year old female presents with rash.  Started on Wednesday.  Patient reports that her rash is diffuse and is itchy, red and raised.  She believes that this is poison ivy.  She has tried several home treatments without relief.  Interfering with sleep.  Severe.  She states that she was given some corn and believes that this may have been the source of her rash.  No other exposures.  No other complaints or concerns at this time.  Past Medical History:  Diagnosis Date   Basal cell carcinoma 09/25/2007   right pretibial   Past Surgical History:  Procedure Laterality Date   CHOLECYSTECTOMY     OB History   No obstetric history on file.    Home Medications    Prior to Admission medications   Medication Sig Start Date End Date Taking? Authorizing Provider  cetirizine (ZYRTEC) 10 MG tablet TK 1 T PO BID 01/27/19  Yes [provider]  diazepam (VALIUM) 10 MG tablet Take by mouth. 10/24/13  Yes [provider]  hydrOXYzine (ATARAX/VISTARIL) 25 MG tablet Take 1 tablet (25 mg total) by mouth every 8 (eight) hours as needed. 02/22/20   Coral Spikes, DO  omeprazole (PRILOSEC) 40 MG capsule TAKE 1 CAPSULE(40 MG) BY MOUTH EVERY DAY 03/31/19   [provider]  pramipexole (MIRAPEX) 0.25 MG tablet TK 1 T PO D 03/25/19   [provider]  predniSONE (DELTASONE) 10 MG tablet 50 mg daily x 3 days, then 40 mg daily x 3 days, then 30 mg daily x 3 days, then 20 mg daily x 3 days, then 10 mg daily x 3 days. 02/22/20   Coral Spikes, DO  tamsulosin (FLOMAX) 0.4 MG CAPS capsule Take 0.4 mg by mouth daily. 08/24/19   [provider]  fluticasone (FLONASE) 50 MCG/ACT nasal spray Place into the nose.  02/22/20  [provider]    Family History History reviewed. No pertinent family  history.  Social History Social History   Tobacco Use   Smoking status: Never Smoker   Smokeless tobacco: Never Used  Scientific laboratory technician Use: Never used  Substance Use Topics   Alcohol use: Not Currently   Drug use: Never     Allergies   Omeprazole magnesium   Review of Systems Review of Systems  Skin: Positive for rash.    Physical Exam Triage Vital Signs ED Triage Vitals  Enc Vitals Group     BP 02/22/20 1105 (!) 120/64     Pulse Rate 02/22/20 1105 76     Resp 02/22/20 1105 14     Temp 02/22/20 1105 98.1 F (36.7 C)     Temp Source 02/22/20 1105 Oral     SpO2 02/22/20 1105 100 %     Weight 02/22/20 1101 165 lb (74.8 kg)     Height 02/22/20 1101 5\' 8"  (1.727 m)     Head Circumference --      Peak Flow --      Pain Score 02/22/20 1101 0     Pain Loc --      Pain Edu? --      Excl. in [place]? --    Updated Vital Signs BP (!) 120/64 (BP Location: Left Arm)    Pulse 76    Temp  98.1 F (36.7 C) (Oral)    Resp 14    Ht 5\' 8"  (1.727 m)    Wt 74.8 kg    SpO2 100%    BMI 25.09 kg/m   Visual Acuity Right Eye Distance:   Left Eye Distance:   Bilateral Distance:    Right Eye Near:   Left Eye Near:    Bilateral Near:     Physical Exam Vitals and nursing note reviewed.  Constitutional:      General: She is not in acute distress.    Appearance: Normal appearance. She is not ill-appearing.  HENT:     Head: Normocephalic and atraumatic.  Eyes:     General:        Right eye: No discharge.        Left eye: No discharge.     Conjunctiva/sclera: Conjunctivae normal.  Pulmonary:     Effort: Pulmonary effort is normal. No respiratory distress.  Skin:    Comments: Diffuse erythematous papular rash.  Neurological:     Mental Status: She is alert.  Psychiatric:        Mood and Affect: Mood normal.        Behavior: Behavior normal.    UC Treatments / Results  Labs (all labs ordered are listed, but only abnormal results are displayed) Labs Reviewed - No  data to display  EKG   Radiology No results found.  Procedures Procedures (including critical care time)  Medications Ordered in UC Medications  methylPREDNISolone sodium succinate (SOLU-MEDROL) 40 mg/mL injection 80 mg (80 mg Intramuscular Given 02/22/20 1123)    Initial Impression / Assessment and Plan / UC Course  I have reviewed the triage vital signs and the nursing notes.  Pertinent labs & imaging results that were available during my care of the patient were reviewed by me and considered in my medical decision making (see chart for details).  74 year old female presents with dermatitis secondary to plants.  Treating with hydroxyzine and prednisone.  Solu-Medrol given in the clinic today.  Final Clinical Impressions(s) / UC Diagnoses   Final diagnoses:  Dermatitis due to plants, including poison ivy, sumac, and oak   Discharge Instructions   None    ED Prescriptions    Medication Sig Dispense Auth. Provider   hydrOXYzine (ATARAX/VISTARIL) 25 MG tablet Take 1 tablet (25 mg total) by mouth every 8 (eight) hours as needed. 30 tablet Lynell Kussman G, DO   predniSONE (DELTASONE) 10 MG tablet 50 mg daily x 3 days, then 40 mg daily x 3 days, then 30 mg daily x 3 days, then 20 mg daily x 3 days, then 10 mg daily x 3 days. 45 tablet Coral Spikes, DO     PDMP not reviewed this encounter.   Coral Spikes, [place] 02/22/20 1151

## 2020-02-23 ENCOUNTER — Ambulatory Visit: Payer: Medicare Other | Admitting: Dermatology

## 2020-04-08 DIAGNOSIS — H6980 Other specified disorders of Eustachian tube, unspecified ear: Secondary | ICD-10-CM | POA: Diagnosis not present

## 2020-04-08 DIAGNOSIS — H6123 Impacted cerumen, bilateral: Secondary | ICD-10-CM | POA: Diagnosis not present

## 2020-04-08 DIAGNOSIS — J301 Allergic rhinitis due to pollen: Secondary | ICD-10-CM | POA: Diagnosis not present

## 2020-05-13 ENCOUNTER — Encounter: Payer: Medicare Other | Admitting: Dermatology

## 2020-05-22 ENCOUNTER — Other Ambulatory Visit: Payer: Self-pay | Admitting: Internal Medicine

## 2020-05-26 ENCOUNTER — Ambulatory Visit (INDEPENDENT_AMBULATORY_CARE_PROVIDER_SITE_OTHER): Payer: Medicare Other | Admitting: Internal Medicine

## 2020-05-26 ENCOUNTER — Encounter: Payer: Self-pay | Admitting: Internal Medicine

## 2020-05-26 ENCOUNTER — Other Ambulatory Visit: Payer: Self-pay

## 2020-05-26 VITALS — BP 130/64 | HR 72 | Ht 65.0 in | Wt 174.0 lb

## 2020-05-26 DIAGNOSIS — I10 Essential (primary) hypertension: Secondary | ICD-10-CM

## 2020-05-26 DIAGNOSIS — E8881 Metabolic syndrome: Secondary | ICD-10-CM | POA: Diagnosis not present

## 2020-05-26 DIAGNOSIS — I2510 Atherosclerotic heart disease of native coronary artery without angina pectoris: Secondary | ICD-10-CM

## 2020-05-26 DIAGNOSIS — F419 Anxiety disorder, unspecified: Secondary | ICD-10-CM | POA: Diagnosis not present

## 2020-05-26 MED ORDER — DIAZEPAM 10 MG PO TABS: 10.0000 mg | ORAL_TABLET | Freq: Every day | ORAL | 2 refills | Status: DC

## 2020-05-30 ENCOUNTER — Encounter: Payer: Self-pay | Admitting: Internal Medicine

## 2020-05-30 DIAGNOSIS — I10 Essential (primary) hypertension: Secondary | ICD-10-CM | POA: Insufficient documentation

## 2020-05-30 DIAGNOSIS — F419 Anxiety disorder, unspecified: Secondary | ICD-10-CM | POA: Insufficient documentation

## 2020-05-30 DIAGNOSIS — I2510 Atherosclerotic heart disease of native coronary artery without angina pectoris: Secondary | ICD-10-CM | POA: Insufficient documentation

## 2020-05-30 DIAGNOSIS — E8881 Metabolic syndrome: Secondary | ICD-10-CM | POA: Insufficient documentation

## 2020-05-30 NOTE — Assessment & Plan Note (Addendum)
Patient has atypical chest pain was seen at [facility] last year.  She has a remote history of cardiac cath.  She not having any angina.  She is walking 1 hour a day and follow low-cholesterol diet.

## 2020-05-30 NOTE — Progress Notes (Signed)
 Established Patient Office Visit  Subjective:  Patient ID: Carrie Vargas, female    DOB: 05/17/1946  Age: 74 y.o. MRN: [id number]  CC:  Chief Complaint  Patient presents with  . Anxiety    patient here for med refill     Anxiety Presents for follow-up visit. Symptoms include chest pain, dizziness, excessive worry and palpitations. Patient reports no confusion, decreased concentration, depressed mood, hyperventilation, irritability, malaise or nausea. Symptoms occur most days. The quality of sleep is fair.   Compliance with medications is 51-75%.    Carrie Vargas anxiety  Past Medical History:  Diagnosis Date  . Basal cell carcinoma 09/25/2007   right pretibial    Past Surgical History:  Procedure Laterality Date  . CHOLECYSTECTOMY      History reviewed. No pertinent family history.  Social History   Socioeconomic History  . Marital status: Married    Spouse name: Not on file  . Number of children: Not on file  . Years of education: Not on file  . Highest education level: Not on file  Occupational History  . Not on file  Tobacco Use  . Smoking status: Never Smoker  . Smokeless tobacco: Never Used  Vaping Use  . Vaping Use: Never used  Substance and Sexual Activity  . Alcohol use: Not Currently  . Drug use: Never  . Sexual activity: Not on file  Other Topics Concern  . Not on file  Social History Narrative  . Not on file   Social Determinants of Health   Financial Resource Strain:   . Difficulty of Paying Living Expenses: Not on file  Food Insecurity:   . Worried About Running Out of Food in the Last Year: Not on file  . Ran Out of Food in the Last Year: Not on file  Transportation Needs:   . Lack of Transportation (Medical): Not on file  . Lack of Transportation (Non-Medical): Not on file  Physical Activity:   . Days of Exercise per Week: Not on file  . Minutes of Exercise per Session: Not on file  Stress:   . Feeling of Stress : Not on file    Social Connections:   . Frequency of Communication with Friends and Family: Not on file  . Frequency of Social Gatherings with Friends and Family: Not on file  . Attends Religious Services: Not on file  . Active Member of Clubs or Organizations: Not on file  . Attends Club or Organization Meetings: Not on file  . Marital Status: Not on file  Intimate Partner Violence:   . Fear of Current or Ex-Partner: Not on file  . Emotionally Abused: Not on file  . Physically Abused: Not on file  . Sexually Abused: Not on file     Current Outpatient Medications:  .  cetirizine (ZYRTEC) 10 MG tablet, TK 1 T PO BID, Disp: , Rfl:  .  diazepam (VALIUM) 10 MG tablet, Take 1 tablet (10 mg total) by mouth daily., Disp: 30 tablet, Rfl: 2 .  hydrOXYzine (ATARAX/VISTARIL) 25 MG tablet, Take 1 tablet (25 mg total) by mouth every 8 (eight) hours as needed., Disp: 30 tablet, Rfl: 0 .  omeprazole (PRILOSEC) 40 MG capsule, TAKE 1 CAPSULE(40 MG) BY MOUTH EVERY DAY, Disp: , Rfl:  .  pramipexole (MIRAPEX) 0.25 MG tablet, TAKE 1 TABLET BY MOUTH DAILY, Disp: 90 tablet, Rfl: 3 .  predniSONE (DELTASONE) 10 MG tablet, 50 mg daily x 3 days, then 40 mg daily x 3   days, then 30 mg daily x 3 days, then 20 mg daily x 3 days, then 10 mg daily x 3 days., Disp: 45 tablet, Rfl: 0 .  tamsulosin (FLOMAX) 0.4 MG CAPS capsule, Take 0.4 mg by mouth daily., Disp: , Rfl:    Allergies  Allergen Reactions  . Omeprazole Magnesium Other (See Comments)    Makes joints ache really badly    ROS Review of Systems  Constitutional: Negative.  Negative for irritability.  HENT: Negative.   Eyes: Negative.   Respiratory: Negative.   Cardiovascular: Positive for chest pain and palpitations.  Gastrointestinal: Negative.  Negative for nausea.  Endocrine: Negative.   Genitourinary: Negative.   Musculoskeletal: Negative.   Skin: Negative.   Allergic/Immunologic: Negative.   Neurological: Positive for dizziness.  Hematological: Negative.    Psychiatric/Behavioral: Negative.  Negative for confusion and decreased concentration.  All other systems reviewed and are negative.     Objective:    Physical Exam Vitals reviewed.  Constitutional:      Appearance: Normal appearance.  HENT:     Mouth/Throat:     Mouth: Mucous membranes are moist.  Eyes:     Pupils: Pupils are equal, round, and reactive to light.  Neck:     Vascular: No carotid bruit.  Cardiovascular:     Rate and Rhythm: Normal rate and regular rhythm.     Pulses: Normal pulses.     Heart sounds: Normal heart sounds.  Pulmonary:     Effort: Pulmonary effort is normal.     Breath sounds: Normal breath sounds.  Abdominal:     General: Bowel sounds are normal.     Palpations: Abdomen is soft. There is no hepatomegaly, splenomegaly or mass.     Tenderness: There is no abdominal tenderness.     Hernia: No hernia is present.  Musculoskeletal:        General: No tenderness.     Cervical back: Neck supple.     Right lower leg: No edema.     Left lower leg: No edema.  Skin:    Findings: No rash.  Neurological:     Mental Status: She is alert and oriented to person, place, and time.     Motor: No weakness.  Psychiatric:        Mood and Affect: Mood and affect normal.        Behavior: Behavior normal.     BP 130/64   Pulse 72   Ht 5' 5" (1.651 m)   Wt 174 lb (78.9 kg)   BMI 28.96 kg/m  Wt Readings from Last 3 Encounters:  05/26/20 174 lb (78.9 kg)  02/22/20 165 lb (74.8 kg)     Health Maintenance Due  Topic Date Due  . Hepatitis C Screening  Never done  . COVID-19 Vaccine (1) Never done  . TETANUS/TDAP  Never done  . DEXA SCAN  Never done  . PNA vac Low Risk Adult (1 of 2 - PCV13) Never done  . INFLUENZA VACCINE  Never done    There are no preventive care reminders to display for this patient.  No results found for: TSH No results found for: WBC, HGB, HCT, MCV, PLT No results found for: NA, K, CHLORIDE, CO2, GLUCOSE, BUN, CREATININE,  BILITOT, ALKPHOS, AST, ALT, PROT, ALBUMIN, CALCIUM, ANIONGAP, EGFR, GFR No results found for: CHOL No results found for: HDL No results found for: LDLCALC No results found for: TRIG No results found for: CHOLHDL No results found for: HGBA1C  Assessment & Plan:   Problem List Items Addressed This Visit      Cardiovascular and Mediastinum   Coronary artery disease, non-occlusive    Patient has atypical chest pain was seen at [facility] last year.  She has a remote history of cardiac cath.  She not having any angina.  She is walking 1 hour a day and follow low-cholesterol diet.      Essential hypertension - Primary    Patient blood pressure is stable        Other   Anxiety    - Patient experiencing high levels of anxiety.  - Encouraged patient to engage in relaxing activities like yoga, meditation, journaling, going for a walk, or participating in a hobby.  - Encouraged patient to reach out to trusted friends or family members about recent struggles       Relevant Medications   diazepam (VALIUM) 10 MG tablet   Metabolic syndrome    - I encouraged the patient to lose weight.  - I educated them on making healthy dietary choices including eating more fruits and vegetables and less fried foods. - I encouraged the patient to exercise more, and educated on the benefits of exercise including weight loss, diabetes prevention, and hypertension prevention.           Meds ordered this encounter  Medications  . diazepam (VALIUM) 10 MG tablet    Sig: Take 1 tablet (10 mg total) by mouth daily.    Dispense:  30 tablet    Refill:  2    Follow-up: Patient was advised to take statin.  She was also advised to take Covid shot.   Cletis Athens, MD

## 2020-05-30 NOTE — Assessment & Plan Note (Signed)
-   I encouraged the patient to lose weight.  - I educated them on making healthy dietary choices including eating more fruits and vegetables and less fried foods. - I encouraged the patient to exercise more, and educated on the benefits of exercise including weight loss, diabetes prevention, and hypertension prevention.   

## 2020-05-30 NOTE — Assessment & Plan Note (Signed)
Patient blood pressure is stable

## 2020-05-30 NOTE — Assessment & Plan Note (Signed)
-   Patient experiencing high levels of anxiety.  - Encouraged patient to engage in relaxing activities like yoga, meditation, journaling, going for a walk, or participating in a hobby.  - Encouraged patient to reach out to trusted friends or family members about recent struggles 

## 2020-07-28 ENCOUNTER — Other Ambulatory Visit (INDEPENDENT_AMBULATORY_CARE_PROVIDER_SITE_OTHER): Payer: Medicare Other

## 2020-07-28 DIAGNOSIS — Z20822 Contact with and (suspected) exposure to covid-19: Secondary | ICD-10-CM

## 2020-07-28 LAB — POC COVID19 BINAXNOW: SARS Coronavirus 2 Ag: POSITIVE — AB

## 2020-08-01 ENCOUNTER — Other Ambulatory Visit: Payer: Self-pay | Admitting: Nurse Practitioner

## 2020-08-01 DIAGNOSIS — U071 COVID-19: Secondary | ICD-10-CM

## 2020-08-01 DIAGNOSIS — E663 Overweight: Secondary | ICD-10-CM

## 2020-08-01 NOTE — Progress Notes (Signed)
I connected by phone with Carrie Vargas on 08/01/2020 at 1:30 PM to discuss the potential use of a new treatment for mild to moderate COVID-19 viral infection in non-hospitalized patients.  This patient is a 75 y.o. female that meets the FDA criteria for Emergency Use Authorization of COVID monoclonal antibody casirivimab/imdevimab, bamlanivimab/etesevimab, or sotrovimab.  Has a (+) direct SARS-CoV-2 viral test result  Has mild or moderate COVID-19   Is NOT hospitalized due to COVID-19  Is within 10 days of symptom onset  Has at least one of the high risk factor(s) for progression to severe COVID-19 and/or hospitalization as defined in EUA.  Specific high risk criteria : Older age (>/= 77 yo) and BMI > 25   I have spoken and communicated the following to the patient or parent/caregiver regarding COVID monoclonal antibody treatment:  1. FDA has authorized the emergency use for the treatment of mild to moderate COVID-19 in adults and pediatric patients with positive results of direct SARS-CoV-2 viral testing who are 104 years of age and older weighing at least 40 kg, and who are at high risk for progressing to severe COVID-19 and/or hospitalization.  2. The significant known and potential risks and benefits of COVID monoclonal antibody, and the extent to which such potential risks and benefits are unknown.  3. Information on available alternative treatments and the risks and benefits of those alternatives, including clinical trials.  4. Patients treated with COVID monoclonal antibody should continue to self-isolate and use infection control measures (e.g., wear mask, isolate, social distance, avoid sharing personal items, clean and disinfect "high touch" surfaces, and frequent handwashing) according to CDC guidelines.   5. The patient or parent/caregiver has the option to accept or refuse COVID monoclonal antibody treatment.  After reviewing this information with the patient, the patient has  agreed to receive one of the available covid 19 monoclonal antibodies and will be provided an appropriate fact sheet prior to infusion.   Nicolasa Ducking, NP 08/01/2020 1:30 PM

## 2020-08-02 ENCOUNTER — Other Ambulatory Visit (INDEPENDENT_AMBULATORY_CARE_PROVIDER_SITE_OTHER): Payer: Medicare Other

## 2020-08-02 ENCOUNTER — Ambulatory Visit (HOSPITAL_COMMUNITY)
Admission: RE | Admit: 2020-08-02 | Discharge: 2020-08-02 | Disposition: A | Discharge status: Home / Self Care | Payer: Medicare Other | Source: Ambulatory Visit | Attending: Pulmonary Disease | Admitting: Pulmonary Disease

## 2020-08-02 DIAGNOSIS — E663 Overweight: Secondary | ICD-10-CM | POA: Diagnosis not present

## 2020-08-02 DIAGNOSIS — R54 Age-related physical debility: Secondary | ICD-10-CM | POA: Insufficient documentation

## 2020-08-02 DIAGNOSIS — Z6825 Body mass index (BMI) 25.0-25.9, adult: Secondary | ICD-10-CM | POA: Diagnosis not present

## 2020-08-02 DIAGNOSIS — U071 COVID-19: Secondary | ICD-10-CM | POA: Diagnosis not present

## 2020-08-02 DIAGNOSIS — Z20822 Contact with and (suspected) exposure to covid-19: Secondary | ICD-10-CM | POA: Diagnosis not present

## 2020-08-02 MED ORDER — SODIUM CHLORIDE 0.9 % IV SOLN: INTRAVENOUS | Status: DC | PRN

## 2020-08-02 MED ORDER — ALBUTEROL SULFATE HFA 108 (90 BASE) MCG/ACT IN AERS: 2.0000 | INHALATION_SPRAY | Freq: Once | RESPIRATORY_TRACT | Status: DC | PRN

## 2020-08-02 MED ORDER — FAMOTIDINE IN NACL 20-0.9 MG/50ML-% IV SOLN: 20.0000 mg | Freq: Once | INTRAVENOUS | Status: DC | PRN

## 2020-08-02 MED ORDER — EPINEPHRINE 0.3 MG/0.3ML IJ SOAJ: 0.3000 mg | Freq: Once | INTRAMUSCULAR | Status: DC | PRN

## 2020-08-02 MED ORDER — DIPHENHYDRAMINE HCL 50 MG/ML IJ SOLN: 50.0000 mg | Freq: Once | INTRAMUSCULAR | Status: DC | PRN

## 2020-08-02 MED ORDER — METHYLPREDNISOLONE SODIUM SUCC 125 MG IJ SOLR: 125.0000 mg | Freq: Once | INTRAMUSCULAR | Status: DC | PRN

## 2020-08-02 MED ORDER — ONDANSETRON HCL 4 MG/2ML IJ SOLN
4.0000 mg | Freq: Once | INTRAMUSCULAR | Status: AC
  Administered 2020-08-02: 4 mg via INTRAVENOUS
  Filled 2020-08-02: qty 2

## 2020-08-02 MED ORDER — SOTROVIMAB 500 MG/8ML IV SOLN
500.0000 mg | Freq: Once | INTRAVENOUS | Status: AC
  Administered 2020-08-02: 500 mg via INTRAVENOUS

## 2020-08-02 NOTE — Discharge Instructions (Signed)
10 Things You Can Do to Manage Your COVID-19 Symptoms at Home If you have possible or confirmed COVID-19: 1. Stay home from work and school. And stay away from other public places. If you must go out, avoid using any kind of public transportation, ridesharing, or taxis. 2. Monitor your symptoms carefully. If your symptoms get worse, call your healthcare provider immediately. 3. Get rest and stay hydrated. 4. If you have a medical appointment, call the healthcare provider ahead of time and tell them that you have or may have COVID-19. 5. For medical emergencies, call 911 and notify the dispatch personnel that you have or may have COVID-19. 6. Cover your cough and sneezes with a tissue or use the inside of your elbow. 7. Wash your hands often with soap and water for at least 20 seconds or clean your hands with an alcohol-based hand sanitizer that contains at least 60% alcohol. 8. As much as possible, stay in a specific room and away from other people in your home. Also, you should use a separate bathroom, if available. If you need to be around other people in or outside of the home, wear a mask. 9. Avoid sharing personal items with other people in your household, like dishes, towels, and bedding. 10. Clean all surfaces that are touched often, like counters, tabletops, and doorknobs. Use household cleaning sprays or wipes according to the label instructions. cdc.gov/coronavirus 01/29/2019 This information is not intended to replace advice given to you by your health care provider. Make sure you discuss any questions you have with your health care provider. Document Revised: 07/03/2019 Document Reviewed: 07/03/2019 Elsevier Patient Education  2020 Elsevier Inc. What types of side effects do monoclonal antibody drugs cause?  Common side effects  In general, the more common side effects caused by monoclonal antibody drugs include: . Allergic reactions, such as hives or itching . Flu-like signs and  symptoms, including chills, fatigue, fever, and muscle aches and pains . Nausea, vomiting . Diarrhea . Skin rashes . Low blood pressure   The CDC is recommending patients who receive monoclonal antibody treatments wait at least 90 days before being vaccinated.  Currently, there are no data on the safety and efficacy of mRNA COVID-19 vaccines in persons who received monoclonal antibodies or convalescent plasma as part of COVID-19 treatment. Based on the estimated half-life of such therapies as well as evidence suggesting that reinfection is uncommon in the 90 days after initial infection, vaccination should be deferred for at least 90 days, as a precautionary measure until additional information becomes available, to avoid interference of the antibody treatment with vaccine-induced immune responses. If you have any questions or concerns after the infusion please call the Advanced Practice Provider on call at [phone]. This number is ONLY intended for your use regarding questions or concerns about the infusion post-treatment side-effects.  Please do not provide this number to others for use. For return to work notes please contact your primary care provider.   If someone you know is interested in receiving treatment please have them call the COVID hotline at [phone].   

## 2020-08-02 NOTE — Progress Notes (Signed)
Diagnosis: COVID-19  Physician: Dr. Patrick Wright  Procedure: Covid Infusion Clinic Med: Sotrovimab infusion - Provided patient with sotrovimab fact sheet for patients, parents, and caregivers prior to infusion.   Complications: No immediate complications noted  Discharge: Discharged home    

## 2020-08-02 NOTE — Progress Notes (Signed)
Patient reviewed Fact Sheet for Patients, Parents, and Caregivers for Emergency Use Authorization (EUA) of Sotrovimab for the Treatment of Coronavirus. Patient also reviewed and is agreeable to the estimated cost of treatment. Patient is agreeable to proceed.   

## 2020-08-08 ENCOUNTER — Other Ambulatory Visit: Payer: Self-pay | Admitting: Family Medicine

## 2020-08-08 DIAGNOSIS — R059 Cough, unspecified: Secondary | ICD-10-CM

## 2020-08-08 DIAGNOSIS — U071 COVID-19: Secondary | ICD-10-CM

## 2020-08-08 MED ORDER — DOXYCYCLINE HYCLATE 100 MG PO TABS: 100.0000 mg | ORAL_TABLET | Freq: Two times a day (BID) | ORAL | 0 refills | Status: DC

## 2020-08-08 MED ORDER — ALBUTEROL SULFATE (2.5 MG/3ML) 0.083% IN NEBU: 2.5000 mg | INHALATION_SOLUTION | Freq: Four times a day (QID) | RESPIRATORY_TRACT | 12 refills | Status: AC | PRN

## 2020-08-08 NOTE — Progress Notes (Signed)
For covid pneumonia and cough--to f/u with Dr Lavera Guise.

## 2020-08-09 DIAGNOSIS — H6123 Impacted cerumen, bilateral: Secondary | ICD-10-CM | POA: Diagnosis not present

## 2020-08-09 DIAGNOSIS — H93293 Other abnormal auditory perceptions, bilateral: Secondary | ICD-10-CM | POA: Diagnosis not present

## 2020-08-09 DIAGNOSIS — H6983 Other specified disorders of Eustachian tube, bilateral: Secondary | ICD-10-CM | POA: Diagnosis not present

## 2020-08-27 LAB — POC COVID19 BINAXNOW: SARS Coronavirus 2 Ag: POSITIVE — AB

## 2020-08-27 NOTE — Addendum Note (Signed)
Addended by: Alois Cliche on: 08/27/2020 03:03 PM   Modules accepted: Orders

## 2020-09-11 DIAGNOSIS — Z1231 Encounter for screening mammogram for malignant neoplasm of breast: Secondary | ICD-10-CM | POA: Diagnosis not present

## 2020-09-13 ENCOUNTER — Other Ambulatory Visit: Payer: Self-pay

## 2020-09-13 ENCOUNTER — Encounter: Payer: Self-pay | Admitting: Internal Medicine

## 2020-09-13 ENCOUNTER — Ambulatory Visit (INDEPENDENT_AMBULATORY_CARE_PROVIDER_SITE_OTHER): Payer: Medicare Other | Admitting: Internal Medicine

## 2020-09-13 VITALS — BP 124/64 | HR 84 | Ht 65.0 in | Wt 174.0 lb

## 2020-09-13 DIAGNOSIS — E8881 Metabolic syndrome: Secondary | ICD-10-CM | POA: Diagnosis not present

## 2020-09-13 DIAGNOSIS — I10 Essential (primary) hypertension: Secondary | ICD-10-CM | POA: Diagnosis not present

## 2020-09-13 DIAGNOSIS — F419 Anxiety disorder, unspecified: Secondary | ICD-10-CM | POA: Diagnosis not present

## 2020-09-13 DIAGNOSIS — I2510 Atherosclerotic heart disease of native coronary artery without angina pectoris: Secondary | ICD-10-CM

## 2020-09-13 NOTE — Assessment & Plan Note (Signed)
CAD is stable.

## 2020-09-13 NOTE — Progress Notes (Signed)
Established Patient Office Visit  Subjective:  Patient ID: Carrie Vargas, female    DOB: 19-Jan-1946  Age: 75 y.o. MRN: [id number]  CC:  Chief Complaint  Patient presents with  . Hypertension  . Anxiety    HPI  Carrie Vargas presents for a general checkup.  She has problem with anxiety neurosis and insomnia and has been taking Valium Past Medical History:  Diagnosis Date  . Basal cell carcinoma 09/25/2007   right pretibial    Past Surgical History:  Procedure Laterality Date  . CHOLECYSTECTOMY      History reviewed. No pertinent family history.  Social History   Socioeconomic History  . Marital status: Married    Spouse name: Not on file  . Number of children: Not on file  . Years of education: Not on file  . Highest education level: Not on file  Occupational History  . Not on file  Tobacco Use  . Smoking status: Never Smoker  . Smokeless tobacco: Never Used  Vaping Use  . Vaping Use: Never used  Substance and Sexual Activity  . Alcohol use: Not Currently  . Drug use: Never  . Sexual activity: Not on file  Other Topics Concern  . Not on file  Social History Narrative  . Not on file   Social Determinants of Health   Financial Resource Strain: Not on file  Food Insecurity: Not on file  Transportation Needs: Not on file  Physical Activity: Not on file  Stress: Not on file  Social Connections: Not on file  Intimate Partner Violence: Not on file     Current Outpatient Medications:  .  albuterol (PROVENTIL) (2.5 MG/3ML) 0.083% nebulizer solution, Take 3 mLs (2.5 mg total) by nebulization every 6 (six) hours as needed for wheezing or shortness of breath., Disp: 75 mL, Rfl: 12 .  cetirizine (ZYRTEC) 10 MG tablet, TK 1 T PO BID, Disp: , Rfl:  .  diazepam (VALIUM) 10 MG tablet, Take 1 tablet (10 mg total) by mouth daily., Disp: 30 tablet, Rfl: 2 .  doxycycline (VIBRA-TABS) 100 MG tablet, Take 1 tablet (100 mg total) by mouth 2 (two) times daily., Disp: 14  tablet, Rfl: 0 .  hydrOXYzine (ATARAX/VISTARIL) 25 MG tablet, Take 1 tablet (25 mg total) by mouth every 8 (eight) hours as needed., Disp: 30 tablet, Rfl: 0 .  omeprazole (PRILOSEC) 40 MG capsule, TAKE 1 CAPSULE(40 MG) BY MOUTH EVERY DAY, Disp: , Rfl:  .  pramipexole (MIRAPEX) 0.25 MG tablet, TAKE 1 TABLET BY MOUTH DAILY, Disp: 90 tablet, Rfl: 3 .  predniSONE (DELTASONE) 10 MG tablet, 50 mg daily x 3 days, then 40 mg daily x 3 days, then 30 mg daily x 3 days, then 20 mg daily x 3 days, then 10 mg daily x 3 days., Disp: 45 tablet, Rfl: 0 .  tamsulosin (FLOMAX) 0.4 MG CAPS capsule, Take 0.4 mg by mouth daily., Disp: , Rfl:    Allergies  Allergen Reactions  . Omeprazole Magnesium Other (See Comments)    Makes joints ache really badly    ROS Review of Systems  Constitutional: Negative.   HENT: Negative.   Eyes: Negative.   Respiratory: Negative.   Cardiovascular: Negative.   Gastrointestinal: Negative.   Endocrine: Negative.   Genitourinary: Negative.  Negative for flank pain.  Musculoskeletal: Negative.  Negative for neck pain and neck stiffness.  Skin: Negative.   Allergic/Immunologic: Negative.   Neurological: Positive for headaches. Negative for dizziness, seizures, syncope and speech  difficulty.  Hematological: Negative.   Psychiatric/Behavioral: Negative for dysphoric mood. The patient is nervous/anxious. The patient is not hyperactive.   All other systems reviewed and are negative.     Objective:    Physical Exam Vitals reviewed.  Constitutional:      Appearance: Normal appearance.  HENT:     Mouth/Throat:     Mouth: Mucous membranes are moist.  Eyes:     Pupils: Pupils are equal, round, and reactive to light.  Neck:     Vascular: No carotid bruit.  Cardiovascular:     Rate and Rhythm: Normal rate and regular rhythm.     Pulses: Normal pulses.     Heart sounds: Normal heart sounds.  Pulmonary:     Effort: Pulmonary effort is normal.     Breath sounds: Normal  breath sounds.  Abdominal:     General: Bowel sounds are normal.     Palpations: Abdomen is soft. There is no hepatomegaly, splenomegaly or mass.     Tenderness: There is no abdominal tenderness.     Hernia: No hernia is present.  Musculoskeletal:        General: No tenderness.     Cervical back: Neck supple.     Right lower leg: No edema.     Left lower leg: No edema.  Skin:    Findings: No bruising or rash.  Neurological:     Mental Status: She is alert and oriented to person, place, and time.     Motor: No weakness.  Psychiatric:        Mood and Affect: Mood and affect normal.        Behavior: Behavior normal.        Thought Content: Thought content normal.     BP 124/64   Pulse 84   Ht 5' 5" (1.651 m)   Wt 174 lb (78.9 kg)   BMI 28.96 kg/m  Wt Readings from Last 3 Encounters:  09/13/20 174 lb (78.9 kg)  05/26/20 174 lb (78.9 kg)  02/22/20 165 lb (74.8 kg)     Health Maintenance Due  Topic Date Due  . Hepatitis C Screening  Never done  . COVID-19 Vaccine (1) Never done  . TETANUS/TDAP  Never done  . DEXA SCAN  Never done  . PNA vac Low Risk Adult (1 of 2 - PCV13) Never done  . INFLUENZA VACCINE  Never done    There are no preventive care reminders to display for this patient.  No results found for: TSH No results found for: WBC, HGB, HCT, MCV, PLT No results found for: NA, K, CHLORIDE, CO2, GLUCOSE, BUN, CREATININE, BILITOT, ALKPHOS, AST, ALT, PROT, ALBUMIN, CALCIUM, ANIONGAP, EGFR, GFR No results found for: CHOL No results found for: HDL No results found for: LDLCALC No results found for: TRIG No results found for: CHOLHDL No results found for: HGBA1C    Assessment & Plan:   Problem List Items Addressed This Visit      Cardiovascular and Mediastinum   Coronary artery disease, non-occlusive    CAD is stable.      Essential hypertension - Primary    Blood pressure is stable without any medication.  Patient follows her diet.        Other    Anxiety    - Patient experiencing high levels of anxiety.  - Encouraged patient to engage in relaxing activities like yoga, meditation, journaling, going for a walk, or participating in a hobby.  - Encouraged patient to   reach out to trusted friends or family members about recent struggles       Metabolic syndrome    - I encouraged the patient to lose weight.  - I educated them on making healthy dietary choices including eating more fruits and vegetables and less fried foods. - I encouraged the patient to exercise more, and educated on the benefits of exercise including weight loss, diabetes prevention, and hypertension prevention.         Patient has restless leg syndrome and anxiety problem she takes hydroxyzine for allergy.  Follow-up: No follow-ups on file.    Cletis Athens, MD

## 2020-09-13 NOTE — Assessment & Plan Note (Signed)
-   Patient experiencing high levels of anxiety.  - Encouraged patient to engage in relaxing activities like yoga, meditation, journaling, going for a walk, or participating in a hobby.  - Encouraged patient to reach out to trusted friends or family members about recent struggles 

## 2020-09-13 NOTE — Assessment & Plan Note (Signed)
-   I encouraged the patient to lose weight.  - I educated them on making healthy dietary choices including eating more fruits and vegetables and less fried foods. - I encouraged the patient to exercise more, and educated on the benefits of exercise including weight loss, diabetes prevention, and hypertension prevention.   

## 2020-09-13 NOTE — Assessment & Plan Note (Signed)
Blood pressure is stable without any medication.  Patient follows her diet.

## 2020-11-18 ENCOUNTER — Other Ambulatory Visit: Payer: Self-pay | Admitting: *Deleted

## 2020-11-18 MED ORDER — ZOLPIDEM TARTRATE 10 MG PO TABS: 10.0000 mg | ORAL_TABLET | Freq: Every evening | ORAL | 4 refills | Status: DC | PRN

## 2020-11-19 ENCOUNTER — Other Ambulatory Visit: Payer: Self-pay

## 2020-11-19 MED ORDER — ZOLPIDEM TARTRATE 10 MG PO TABS: 10.0000 mg | ORAL_TABLET | Freq: Every evening | ORAL | 2 refills | Status: DC | PRN

## 2020-12-06 DIAGNOSIS — J301 Allergic rhinitis due to pollen: Secondary | ICD-10-CM | POA: Diagnosis not present

## 2020-12-06 DIAGNOSIS — H6123 Impacted cerumen, bilateral: Secondary | ICD-10-CM | POA: Diagnosis not present

## 2020-12-17 ENCOUNTER — Other Ambulatory Visit: Payer: Self-pay | Admitting: Internal Medicine

## 2020-12-20 ENCOUNTER — Encounter: Payer: Self-pay | Admitting: Internal Medicine

## 2020-12-20 ENCOUNTER — Other Ambulatory Visit: Payer: Self-pay

## 2020-12-20 ENCOUNTER — Ambulatory Visit (INDEPENDENT_AMBULATORY_CARE_PROVIDER_SITE_OTHER): Payer: Medicare Other | Admitting: Internal Medicine

## 2020-12-20 VITALS — BP 140/80 | HR 85 | Ht 65.0 in | Wt 174.0 lb

## 2020-12-20 DIAGNOSIS — E8881 Metabolic syndrome: Secondary | ICD-10-CM | POA: Diagnosis not present

## 2020-12-20 DIAGNOSIS — I10 Essential (primary) hypertension: Secondary | ICD-10-CM | POA: Diagnosis not present

## 2020-12-20 DIAGNOSIS — I2510 Atherosclerotic heart disease of native coronary artery without angina pectoris: Secondary | ICD-10-CM

## 2020-12-20 DIAGNOSIS — R030 Elevated blood-pressure reading, without diagnosis of hypertension: Secondary | ICD-10-CM | POA: Diagnosis not present

## 2020-12-20 DIAGNOSIS — F419 Anxiety disorder, unspecified: Secondary | ICD-10-CM | POA: Diagnosis not present

## 2020-12-20 MED ORDER — PRAMIPEXOLE DIHYDROCHLORIDE 0.25 MG PO TABS: 0.2500 mg | ORAL_TABLET | Freq: Three times a day (TID) | ORAL | 3 refills | Status: DC

## 2020-12-20 NOTE — Assessment & Plan Note (Signed)
-   Patient experiencing high levels of anxiety.  - Encouraged patient to engage in relaxing activities like yoga, meditation, journaling, going for a walk, or participating in a hobby.  - Encouraged patient to reach out to trusted friends or family members about recent struggles 

## 2020-12-20 NOTE — Assessment & Plan Note (Signed)

## 2020-12-20 NOTE — Progress Notes (Signed)
Established Patient Office Visit  Subjective:  Patient ID: Carrie Vargas, female    DOB: [birth year]  Age: 75 y.o. MRN: [id number]  CC:  Chief Complaint  Patient presents with  . Hypertension    HPI  Carrie Vargas presents for hypertension  Past Medical History:  Diagnosis Date  . Basal cell carcinoma 09/25/2007   right pretibial    Past Surgical History:  Procedure Laterality Date  . CHOLECYSTECTOMY      History reviewed. No pertinent family history.  Social History   Socioeconomic History  . Marital status: Married    Spouse name: Not on file  . Number of children: Not on file  . Years of education: Not on file  . Highest education level: Not on file  Occupational History  . Not on file  Tobacco Use  . Smoking status: Never Smoker  . Smokeless tobacco: Never Used  Vaping Use  . Vaping Use: Never used  Substance and Sexual Activity  . Alcohol use: Not Currently  . Drug use: Never  . Sexual activity: Not on file  Other Topics Concern  . Not on file  Social History Narrative  . Not on file   Social Determinants of Health   Financial Resource Strain: Not on file  Food Insecurity: Not on file  Transportation Needs: Not on file  Physical Activity: Not on file  Stress: Not on file  Social Connections: Not on file  Intimate Partner Violence: Not on file     Current Outpatient Medications:  .  albuterol (PROVENTIL) (2.5 MG/3ML) 0.083% nebulizer solution, Take 3 mLs (2.5 mg total) by nebulization every 6 (six) hours as needed for wheezing or shortness of breath., Disp: 75 mL, Rfl: 12 .  cetirizine (ZYRTEC) 10 MG tablet, TK 1 T PO BID, Disp: , Rfl:  .  diazepam (VALIUM) 10 MG tablet, TAKE 1 TABLET(10 MG) BY MOUTH DAILY, Disp: 90 tablet, Rfl: 0 .  doxycycline (VIBRA-TABS) 100 MG tablet, Take 1 tablet (100 mg total) by mouth 2 (two) times daily., Disp: 14 tablet, Rfl: 0 .  hydrOXYzine (ATARAX/VISTARIL) 25 MG tablet, Take 1 tablet (25 mg total) by mouth every  8 (eight) hours as needed., Disp: 30 tablet, Rfl: 0 .  omeprazole (PRILOSEC) 40 MG capsule, TAKE 1 CAPSULE(40 MG) BY MOUTH EVERY DAY, Disp: , Rfl:  .  pramipexole (MIRAPEX) 0.25 MG tablet, TAKE 1 TABLET BY MOUTH DAILY, Disp: 90 tablet, Rfl: 3 .  pramipexole (MIRAPEX) 0.25 MG tablet, Take 1 tablet (0.25 mg total) by mouth 3 (three) times daily., Disp: 90 tablet, Rfl: 3 .  predniSONE (DELTASONE) 10 MG tablet, 50 mg daily x 3 days, then 40 mg daily x 3 days, then 30 mg daily x 3 days, then 20 mg daily x 3 days, then 10 mg daily x 3 days., Disp: 45 tablet, Rfl: 0 .  tamsulosin (FLOMAX) 0.4 MG CAPS capsule, Take 0.4 mg by mouth daily., Disp: , Rfl:  .  zolpidem (AMBIEN) 10 MG tablet, Take 1 tablet (10 mg total) by mouth at bedtime as needed for sleep., Disp: 30 tablet, Rfl: 2   Allergies  Allergen Reactions  . Omeprazole Magnesium Other (See Comments)    Makes joints ache really badly    ROS Review of Systems  Constitutional: Negative.   HENT: Negative.   Eyes: Negative.   Respiratory: Negative.   Cardiovascular: Negative.   Gastrointestinal: Negative.   Endocrine: Negative.   Genitourinary: Negative.   Musculoskeletal: Negative.  Skin: Negative.   Allergic/Immunologic: Negative.   Neurological: Negative.   Hematological: Negative.   Psychiatric/Behavioral: Negative.   All other systems reviewed and are negative.     Objective:    Physical Exam Vitals reviewed.  Constitutional:      Appearance: Normal appearance.  HENT:     Mouth/Throat:     Mouth: Mucous membranes are moist.  Eyes:     Pupils: Pupils are equal, round, and reactive to light.  Neck:     Vascular: No carotid bruit.  Cardiovascular:     Rate and Rhythm: Normal rate and regular rhythm.     Pulses: Normal pulses.     Heart sounds: Normal heart sounds.  Pulmonary:     Effort: Pulmonary effort is normal.     Breath sounds: Normal breath sounds.  Abdominal:     General: Bowel sounds are normal.      Palpations: Abdomen is soft. There is no hepatomegaly, splenomegaly or mass.     Tenderness: There is no abdominal tenderness.     Hernia: No hernia is present.  Musculoskeletal:        General: No tenderness.     Cervical back: Neck supple.     Right lower leg: No edema.     Left lower leg: No edema.  Skin:    Findings: No rash.  Neurological:     Mental Status: She is alert and oriented to person, place, and time.     Motor: No weakness.  Psychiatric:        Mood and Affect: Mood and affect normal.        Behavior: Behavior normal.     BP 140/80   Pulse 85   Ht $R'5\' 5"'Vc$  (1.651 m)   Wt 174 lb (78.9 kg)   BMI 28.96 kg/m  Wt Readings from Last 3 Encounters:  12/20/20 174 lb (78.9 kg)  09/13/20 174 lb (78.9 kg)  05/26/20 174 lb (78.9 kg)     Health Maintenance Due  Topic Date Due  . COVID-19 Vaccine (1) Never done  . Hepatitis C Screening  Never done  . TETANUS/TDAP  Never done  . DEXA SCAN  Never done  . PNA vac Low Risk Adult (1 of 2 - PCV13) Never done    There are no preventive care reminders to display for this patient.  No results found for: TSH No results found for: WBC, HGB, HCT, MCV, PLT No results found for: NA, K, CHLORIDE, CO2, GLUCOSE, BUN, CREATININE, BILITOT, ALKPHOS, AST, ALT, PROT, ALBUMIN, CALCIUM, ANIONGAP, EGFR, GFR No results found for: CHOL No results found for: HDL No results found for: LDLCALC No results found for: TRIG No results found for: CHOLHDL No results found for: HGBA1C    Assessment & Plan:   Problem List Items Addressed This Visit      Cardiovascular and Mediastinum   Coronary artery disease, non-occlusive    Patient does not have any angina arrhythmia denies any current chest pain.  Heart is regular chest is clear there is no pedal edema      Essential hypertension    Patient blood pressure is normal patient denies any chest pain or shortness of breath there is no history of palpitation or paroxysmal nocturnal dyspnea    patient was advised to follow low-salt low-cholesterol diet    ideally I want to keep systolic blood pressure below 130 mmHg, patient was asked to check blood pressure one times a week and give me a report  on that.  Patient will be follow-up in 3 months  or earlier as needed, patient will call me back for any change in the cardiovascular symptoms           Other   Anxiety - Primary    - Patient experiencing high levels of anxiety.  - Encouraged patient to engage in relaxing activities like yoga, meditation, journaling, going for a walk, or participating in a hobby.  - Encouraged patient to reach out to trusted friends or family members about recent struggles      Metabolic syndrome    - I encouraged the patient to lose weight.  - I educated them on making healthy dietary choices including eating more fruits and vegetables and less fried foods. - I encouraged the patient to exercise more, and educated on the benefits of exercise including weight loss, diabetes prevention, and hypertension prevention.   Dietary counseling with a registered dietician  Referral to a weight management support group (e.g. Weight Watchers, Overeaters Anonymous)  If your BMI is greater than 29 or you have gained more than 15 pounds you should work on weight loss.  Attend a healthy cooking class          Meds ordered this encounter  Medications  . pramipexole (MIRAPEX) 0.25 MG tablet    Sig: Take 1 tablet (0.25 mg total) by mouth 3 (three) times daily.    Dispense:  90 tablet    Refill:  3    Follow-up: No follow-ups on file.    Cletis Athens, MD Subjective:    Patient here for follow-up of elevated blood pressure.  She is exercising and is adherent to a low-salt diet.  Blood pressure is well controlled at home. Cardiac symptoms: none. Patient denies: chest pain. Cardiovascular risk factors: none. Use of agents associated with hypertension: none. History of target organ damage: angina/ prior  myocardial infarction.   Review of Systems Pertinent items noted in HPI and remainder of comprehensive ROS otherwise negative.     Objective:    BP 140/80   Pulse 85   Ht $R'5\' 5"'vy$  (1.651 m)   Wt 174 lb (78.9 kg)   BMI 28.96 kg/m       Assessment:    Hypertension, normal blood pressure no. Evidence of target organ damage: none.    Plan:    Dietary sodium restriction.

## 2020-12-20 NOTE — Patient Instructions (Signed)

## 2020-12-20 NOTE — Assessment & Plan Note (Signed)
Patient blood pressure is normal patient denies any chest pain or shortness of breath there is no history of palpitation or paroxysmal nocturnal dyspnea   patient was advised to follow low-salt low-cholesterol diet    ideally I want to keep systolic blood pressure below 130 mmHg, patient was asked to check blood pressure one times a week and give me a report on that.  Patient will be follow-up in 3 months  or earlier as needed, patient will call me back for any change in the cardiovascular symptoms    

## 2020-12-20 NOTE — Assessment & Plan Note (Signed)
Patient does not have any angina arrhythmia denies any current chest pain.  Heart is regular chest is clear there is no pedal edema

## 2021-03-08 ENCOUNTER — Ambulatory Visit (INDEPENDENT_AMBULATORY_CARE_PROVIDER_SITE_OTHER): Payer: Medicare Other | Admitting: Internal Medicine

## 2021-03-08 ENCOUNTER — Encounter: Payer: Self-pay | Admitting: Internal Medicine

## 2021-03-08 ENCOUNTER — Other Ambulatory Visit: Payer: Self-pay

## 2021-03-08 VITALS — BP 128/64 | HR 87 | Ht 65.0 in | Wt 160.3 lb

## 2021-03-08 DIAGNOSIS — I10 Essential (primary) hypertension: Secondary | ICD-10-CM | POA: Diagnosis not present

## 2021-03-08 DIAGNOSIS — I2510 Atherosclerotic heart disease of native coronary artery without angina pectoris: Secondary | ICD-10-CM | POA: Diagnosis not present

## 2021-03-08 DIAGNOSIS — F419 Anxiety disorder, unspecified: Secondary | ICD-10-CM

## 2021-03-08 DIAGNOSIS — E8881 Metabolic syndrome: Secondary | ICD-10-CM | POA: Diagnosis not present

## 2021-03-08 MED ORDER — ZOLPIDEM TARTRATE 10 MG PO TABS: 10.0000 mg | ORAL_TABLET | Freq: Every evening | ORAL | 2 refills | Status: DC | PRN

## 2021-03-08 MED ORDER — DIAZEPAM 10 MG PO TABS: ORAL_TABLET | ORAL | 0 refills | Status: DC

## 2021-03-08 NOTE — Assessment & Plan Note (Signed)

## 2021-03-08 NOTE — Assessment & Plan Note (Signed)

## 2021-03-08 NOTE — Assessment & Plan Note (Signed)
Hypercholesterolemia  I advised the patient to follow Mediterranean diet This diet is rich in fruits vegetables and whole grain, and This diet is also rich in fish and lean meat Patient should also eat a handful of almonds or walnuts daily Recent heart study indicated that average follow-up on this kind of diet reduces the cardiovascular mortality by 50 to 70%== 

## 2021-03-08 NOTE — Progress Notes (Signed)
Established Patient Office Visit  Subjective:  Patient ID: Carrie Vargas, female    DOB: Oct 02, 1945  Age: 75 y.o. MRN: [id number]  CC:  Chief Complaint  Patient presents with   Anxiety   Hypertension    Anxiety    Hypertension Associated symptoms include anxiety.   Carrie Vargas presents for general checkup she denies any chest pain or shortness of breath.  Patient has a problem with anxiety.  She does not smoke does not drink.  Patient was advised to walk on a daily basis.  Past Medical History:  Diagnosis Date   Basal cell carcinoma 09/25/2007   right pretibial    Past Surgical History:  Procedure Laterality Date   CHOLECYSTECTOMY      History reviewed. No pertinent family history.  Social History   Socioeconomic History   Marital status: Married    Spouse name: Not on file   Number of children: Not on file   Years of education: Not on file   Highest education level: Not on file  Occupational History   Not on file  Tobacco Use   Smoking status: Never   Smokeless tobacco: Never  Vaping Use   Vaping Use: Never used  Substance and Sexual Activity   Alcohol use: Not Currently   Drug use: Never   Sexual activity: Not on file  Other Topics Concern   Not on file  Social History Narrative   Not on file   Social Determinants of Health   Financial Resource Strain: Not on file  Food Insecurity: Not on file  Transportation Needs: Not on file  Physical Activity: Not on file  Stress: Not on file  Social Connections: Not on file  Intimate Partner Violence: Not on file     Current Outpatient Medications:    albuterol (PROVENTIL) (2.5 MG/3ML) 0.083% nebulizer solution, Take 3 mLs (2.5 mg total) by nebulization every 6 (six) hours as needed for wheezing or shortness of breath., Disp: 75 mL, Rfl: 12   cetirizine (ZYRTEC) 10 MG tablet, TK 1 T PO BID, Disp: , Rfl:    doxycycline (VIBRA-TABS) 100 MG tablet, Take 1 tablet (100 mg total) by mouth 2 (two) times  daily., Disp: 14 tablet, Rfl: 0   hydrOXYzine (ATARAX/VISTARIL) 25 MG tablet, Take 1 tablet (25 mg total) by mouth every 8 (eight) hours as needed., Disp: 30 tablet, Rfl: 0   omeprazole (PRILOSEC) 40 MG capsule, TAKE 1 CAPSULE(40 MG) BY MOUTH EVERY DAY, Disp: , Rfl:    pramipexole (MIRAPEX) 0.25 MG tablet, TAKE 1 TABLET BY MOUTH DAILY, Disp: 90 tablet, Rfl: 3   pramipexole (MIRAPEX) 0.25 MG tablet, Take 1 tablet (0.25 mg total) by mouth 3 (three) times daily., Disp: 90 tablet, Rfl: 3   predniSONE (DELTASONE) 10 MG tablet, 50 mg daily x 3 days, then 40 mg daily x 3 days, then 30 mg daily x 3 days, then 20 mg daily x 3 days, then 10 mg daily x 3 days., Disp: 45 tablet, Rfl: 0   tamsulosin (FLOMAX) 0.4 MG CAPS capsule, Take 0.4 mg by mouth daily., Disp: , Rfl:    diazepam (VALIUM) 10 MG tablet, TAKE 1 TABLET(10 MG) BY MOUTH DAILY, Disp: 90 tablet, Rfl: 0   zolpidem (AMBIEN) 10 MG tablet, Take 1 tablet (10 mg total) by mouth at bedtime as needed for sleep., Disp: 30 tablet, Rfl: 2   Allergies  Allergen Reactions   Omeprazole Magnesium Other (See Comments)    Makes joints ache really  badly    ROS Review of Systems  Constitutional: Negative.   HENT: Negative.    Eyes: Negative.   Respiratory: Negative.    Cardiovascular: Negative.   Gastrointestinal: Negative.   Endocrine: Negative.   Genitourinary: Negative.   Musculoskeletal: Negative.   Skin: Negative.   Allergic/Immunologic: Negative.   Neurological: Negative.   Hematological: Negative.   Psychiatric/Behavioral: Negative.    All other systems reviewed and are negative.    Objective:    Physical Exam Vitals reviewed.  Constitutional:      Appearance: Normal appearance.  HENT:     Mouth/Throat:     Mouth: Mucous membranes are moist.  Eyes:     Pupils: Pupils are equal, round, and reactive to light.  Neck:     Vascular: No carotid bruit.  Cardiovascular:     Rate and Rhythm: Normal rate and regular rhythm.     Pulses:  Normal pulses.     Heart sounds: Normal heart sounds.  Pulmonary:     Effort: Pulmonary effort is normal.     Breath sounds: Normal breath sounds.  Abdominal:     General: Bowel sounds are normal.     Palpations: Abdomen is soft. There is no hepatomegaly, splenomegaly or mass.     Tenderness: There is no abdominal tenderness.     Hernia: No hernia is present.  Musculoskeletal:        General: No tenderness.     Cervical back: Neck supple.     Right lower leg: No edema.     Left lower leg: No edema.  Skin:    Findings: No rash.  Neurological:     Mental Status: She is alert and oriented to person, place, and time.     Motor: No weakness.  Psychiatric:        Mood and Affect: Mood and affect normal.        Behavior: Behavior normal.    BP 128/64   Pulse 87   Ht $R'5\' 5"'Eo$  (1.651 m)   Wt 160 lb 4.8 oz (72.7 kg)   BMI 26.68 kg/m  Wt Readings from Last 3 Encounters:  03/08/21 160 lb 4.8 oz (72.7 kg)  12/20/20 174 lb (78.9 kg)  09/13/20 174 lb (78.9 kg)     Health Maintenance Due  Topic Date Due   COVID-19 Vaccine (1) Never done   Hepatitis C Screening  Never done   TETANUS/TDAP  Never done   Zoster Vaccines- Shingrix (1 of 2) Never done   DEXA SCAN  Never done   PNA vac Low Risk Adult (1 of 2 - PCV13) Never done   INFLUENZA VACCINE  02/28/2021    There are no preventive care reminders to display for this patient.  No results found for: TSH No results found for: WBC, HGB, HCT, MCV, PLT No results found for: NA, K, CHLORIDE, CO2, GLUCOSE, BUN, CREATININE, BILITOT, ALKPHOS, AST, ALT, PROT, ALBUMIN, CALCIUM, ANIONGAP, EGFR, GFR No results found for: CHOL No results found for: HDL No results found for: LDLCALC No results found for: TRIG No results found for: CHOLHDL No results found for: HGBA1C    Assessment & Plan:   Problem List Items Addressed This Visit       Cardiovascular and Mediastinum   Coronary artery disease, non-occlusive - Primary     Hypercholesterolemia  I advised the patient to follow Mediterranean diet This diet is rich in fruits vegetables and whole grain, and This diet is also rich in fish and  lean meat Patient should also eat a handful of almonds or walnuts daily Recent heart study indicated that average follow-up on this kind of diet reduces the cardiovascular mortality by 50 to 70%==       Essential hypertension     Patient denies any chest pain or shortness of breath there is no history of palpitation or paroxysmal nocturnal dyspnea   patient was advised to follow low-salt low-cholesterol diet    ideally I want to keep systolic blood pressure below 130 mmHg, patient was asked to check blood pressure one times a week and give me a report on that.  Patient will be follow-up in 3 months  or earlier as needed, patient will call me back for any change in the cardiovascular symptoms Patient was advised to buy a book from local bookstore concerning blood pressure and read several chapters  every day.  This will be supplemented by some of the material we will give him from the office.  Patient should also utilize other resources like YouTube and Internet to learn more about the blood pressure and the diet.         Other   Anxiety    - Patient experiencing high levels of anxiety.  - Encouraged patient to engage in relaxing activities like yoga, meditation, journaling, going for a walk, or participating in a hobby.  - Encouraged patient to reach out to trusted friends or family members about recent struggles, Patient was advised to read A book, how to stop worrying and start living, it is good book to read to control  the stress        Relevant Medications   diazepam (VALIUM) 10 MG tablet   Metabolic syndrome    - I encouraged the patient to lose weight.  - I educated them on making healthy dietary choices including eating more fruits and vegetables and less fried foods. - I encouraged the patient to exercise  more, and educated on the benefits of exercise including weight loss, diabetes prevention, and hypertension prevention.   Dietary counseling with a registered dietician  Referral to a weight management support group (e.g. Weight Watchers, Overeaters Anonymous)  If your BMI is greater than 29 or you have gained more than 15 pounds you should work on weight loss.  Attend a healthy cooking class         Meds ordered this encounter  Medications   diazepam (VALIUM) 10 MG tablet    Sig: TAKE 1 TABLET(10 MG) BY MOUTH DAILY    Dispense:  90 tablet    Refill:  0   zolpidem (AMBIEN) 10 MG tablet    Sig: Take 1 tablet (10 mg total) by mouth at bedtime as needed for sleep.    Dispense:  30 tablet    Refill:  2    Follow-up: No follow-ups on file.    Cletis Athens, MD

## 2021-03-08 NOTE — Assessment & Plan Note (Signed)
-   Patient experiencing high levels of anxiety.  - Encouraged patient to engage in relaxing activities like yoga, meditation, journaling, going for a walk, or participating in a hobby.  - Encouraged patient to reach out to trusted friends or family members about recent struggles, Patient was advised to read A book, how to stop worrying and start living, it is good book to read to control  the stress  

## 2021-03-17 ENCOUNTER — Other Ambulatory Visit: Payer: Self-pay | Admitting: Internal Medicine

## 2021-03-17 ENCOUNTER — Ambulatory Visit (INDEPENDENT_AMBULATORY_CARE_PROVIDER_SITE_OTHER): Payer: Medicare Other | Admitting: Dermatology

## 2021-03-17 ENCOUNTER — Other Ambulatory Visit: Payer: Self-pay

## 2021-03-17 DIAGNOSIS — D1800 Hemangioma unspecified site: Secondary | ICD-10-CM | POA: Diagnosis not present

## 2021-03-17 DIAGNOSIS — L820 Inflamed seborrheic keratosis: Secondary | ICD-10-CM | POA: Diagnosis not present

## 2021-03-17 DIAGNOSIS — I2510 Atherosclerotic heart disease of native coronary artery without angina pectoris: Secondary | ICD-10-CM | POA: Diagnosis not present

## 2021-03-17 DIAGNOSIS — Z1283 Encounter for screening for malignant neoplasm of skin: Secondary | ICD-10-CM

## 2021-03-17 DIAGNOSIS — L814 Other melanin hyperpigmentation: Secondary | ICD-10-CM

## 2021-03-17 DIAGNOSIS — D229 Melanocytic nevi, unspecified: Secondary | ICD-10-CM

## 2021-03-17 DIAGNOSIS — L578 Other skin changes due to chronic exposure to nonionizing radiation: Secondary | ICD-10-CM | POA: Diagnosis not present

## 2021-03-17 DIAGNOSIS — L821 Other seborrheic keratosis: Secondary | ICD-10-CM | POA: Diagnosis not present

## 2021-03-17 NOTE — Progress Notes (Signed)
   Follow-Up Visit   Subjective  Carrie Vargas is a 75 y.o. female who presents for the following: Skin Problem and Annual Exam (Mole check ). Hx of BCC right pretibial 2009 The patient presents for Total-Body Skin Exam (TBSE) for skin cancer screening and mole check.   The following portions of the chart were reviewed this encounter and updated as appropriate:   Tobacco  Allergies  Meds  Problems  Med Hx  Surg Hx  Fam Hx     Review of Systems:  No other skin or systemic complaints except as noted in HPI or Assessment and Plan.  Objective  Well appearing patient in no apparent distress; mood and affect are within normal limits.  A full examination was performed including scalp, head, eyes, ears, nose, lips, neck, chest, axillae, abdomen, back, buttocks, bilateral upper extremities, bilateral lower extremities, hands, feet, fingers, toes, fingernails, and toenails. All findings within normal limits unless otherwise noted below.  right mid back braline, left low back (4) Erythematous keratotic or waxy stuck-on papule or plaque.    Assessment & Plan  Inflamed seborrheic keratosis right mid back braline, left low back  Destruction of lesion - right mid back braline, left low back Complexity: simple   Destruction method: cryotherapy   Informed consent: discussed and consent obtained   Timeout:  patient name, date of birth, surgical site, and procedure verified Lesion destroyed using liquid nitrogen: Yes   Region frozen until ice ball extended beyond lesion: Yes   Outcome: patient tolerated procedure well with no complications   Post-procedure details: wound care instructions given    Lentigines - Scattered tan macules - Due to sun exposure - Benign-appering, observe - Recommend daily broad spectrum sunscreen SPF 30+ to sun-exposed areas, reapply every 2 hours as needed. - Call for any changes  Seborrheic Keratoses - Stuck-on, waxy, tan-brown papules and/or plaques  -  Benign-appearing - Discussed benign etiology and prognosis. - Observe - Call for any changes  Melanocytic Nevi - Tan-brown and/or pink-flesh-colored symmetric macules and papules - Benign appearing on exam today - Observation - Call clinic for new or changing moles - Recommend daily use of broad spectrum spf 30+ sunscreen to sun-exposed areas.   Hemangiomas - Red papules - Discussed benign nature - Observe - Call for any changes  Actinic Damage - Chronic condition, secondary to cumulative UV/sun exposure - diffuse scaly erythematous macules with underlying dyspigmentation - Recommend daily broad spectrum sunscreen SPF 30+ to sun-exposed areas, reapply every 2 hours as needed.  - Staying in the shade or wearing long sleeves, sun glasses (UVA+UVB protection) and wide brim hats (4-inch brim around the entire circumference of the hat) are also recommended for sun protection.  - Call for new or changing lesions.  Skin cancer screening performed today.   Return in about 3 months (around 06/17/2021) for ISK-back .  IMarye Round, CMA, am acting as scribe for Sarina Ser, MD .  Documentation: I have reviewed the above documentation for accuracy and completeness, and I agree with the above.  Sarina Ser, MD

## 2021-03-17 NOTE — Patient Instructions (Addendum)

## 2021-03-21 ENCOUNTER — Encounter: Payer: Self-pay | Admitting: Dermatology

## 2021-04-10 DIAGNOSIS — R079 Chest pain, unspecified: Secondary | ICD-10-CM | POA: Diagnosis not present

## 2021-04-10 DIAGNOSIS — M4856XA Collapsed vertebra, not elsewhere classified, lumbar region, initial encounter for fracture: Secondary | ICD-10-CM | POA: Diagnosis not present

## 2021-04-10 DIAGNOSIS — I440 Atrioventricular block, first degree: Secondary | ICD-10-CM | POA: Diagnosis not present

## 2021-04-10 DIAGNOSIS — R002 Palpitations: Secondary | ICD-10-CM | POA: Diagnosis not present

## 2021-06-02 ENCOUNTER — Other Ambulatory Visit: Payer: Self-pay

## 2021-06-02 ENCOUNTER — Ambulatory Visit (INDEPENDENT_AMBULATORY_CARE_PROVIDER_SITE_OTHER): Payer: Medicare Other | Admitting: Dermatology

## 2021-06-02 DIAGNOSIS — L820 Inflamed seborrheic keratosis: Secondary | ICD-10-CM | POA: Diagnosis not present

## 2021-06-02 NOTE — Progress Notes (Signed)
   Follow-Up Visit   Subjective  Carrie Vargas is a 75 y.o. female who presents for the following: Skin Problem (Pt here to have irritated scaly spots on her back checked today. ).  The following portions of the chart were reviewed this encounter and updated as appropriate:   Tobacco  Allergies  Meds  Problems  Med Hx  Surg Hx  Fam Hx     Review of Systems:  No other skin or systemic complaints except as noted in HPI or Assessment and Plan.  Objective  Well appearing patient in no apparent distress; mood and affect are within normal limits.  A focused examination was performed including back . Relevant physical exam findings are noted in the Assessment and Plan.  back, neck  x 17 (17) Erythematous keratotic or waxy stuck-on papule or plaque.    Assessment & Plan  Inflamed seborrheic keratosis back, neck  x 17  Reassured benign age-related growth.  Recommend observation.  Discussed cryotherapy if spot(s) become irritated or inflamed.   Prior to procedure, discussed risks of blister formation, small wound, skin dyspigmentation, or rare scar following cryotherapy. Recommend Vaseline ointment to treated areas while healing.   Destruction of lesion - back, neck  x 17 Complexity: simple   Destruction method: cryotherapy   Informed consent: discussed and consent obtained   Timeout:  patient name, date of birth, surgical site, and procedure verified Lesion destroyed using liquid nitrogen: Yes   Region frozen until ice ball extended beyond lesion: Yes   Outcome: patient tolerated procedure well with no complications   Post-procedure details: wound care instructions given    Return in about 1 year (around 06/02/2022) for ISK .  I, Marye Round, CMA, am acting as scribe for Sarina Ser, MD .  Documentation: I have reviewed the above documentation for accuracy and completeness, and I agree with the above.  Sarina Ser, MD

## 2021-06-02 NOTE — Patient Instructions (Addendum)
Cryotherapy Aftercare  Wash gently with soap and water everyday.   Apply Vaseline and Band-Aid daily until healed.  Prior to procedure, discussed risks of blister formation, small wound, skin dyspigmentation, or rare scar following cryotherapy. Recommend Vaseline ointment to treated areas while healing.    If you have any questions or concerns for your doctor, please call our main line at [phone] and press option 4 to reach your doctor's medical assistant. If no one answers, please leave a voicemail as directed and we will return your call as soon as possible. Messages left after 4 pm will be answered the following business day.   You may also send us a message via MyChart. We typically respond to MyChart messages within 1-2 business days.  For prescription refills, please ask your pharmacy to contact our office. Our fax number is [phone].  If you have an urgent issue when the clinic is closed that cannot wait until the next business day, you can page your doctor at the number below.    Please note that while we do our best to be available for urgent issues outside of office hours, we are not available 24/7.   If you have an urgent issue and are unable to reach us, you may choose to seek medical care at your doctor's office, retail clinic, urgent care center, or emergency room.  If you have a medical emergency, please immediately call 911 or go to the emergency department.  Pager Numbers  - Dr. Kowalski: [phone]  - Dr. Moye: [phone]  - Dr. Stewart: [phone]  In the event of inclement weather, please call our main line at [phone] for an update on the status of any delays or closures.  Dermatology Medication Tips: Please keep the boxes that topical medications come in in order to help keep track of the instructions about where and how to use these. Pharmacies typically print the medication instructions only on the boxes and not directly on the medication  tubes.   If your medication is too expensive, please contact our office at [phone] option 4 or send us a message through MyChart.   We are unable to tell what your co-pay for medications will be in advance as this is different depending on your insurance coverage. However, we may be able to find a substitute medication at lower cost or fill out paperwork to get insurance to cover a needed medication.   If a prior authorization is required to get your medication covered by your insurance company, please allow us 1-2 business days to complete this process.  Drug prices often vary depending on where the prescription is filled and some pharmacies may offer cheaper prices.  The website www.goodrx.com contains coupons for medications through different pharmacies. The prices here do not account for what the cost may be with help from insurance (it may be cheaper with your insurance), but the website can give you the price if you did not use any insurance.  - You can print the associated coupon and take it with your prescription to the pharmacy.  - You may also stop by our office during regular business hours and pick up a GoodRx coupon card.  - If you need your prescription sent electronically to a different pharmacy, notify our office through [facility] MyChart or by phone at [phone] option 4.  

## 2021-06-06 ENCOUNTER — Encounter: Payer: Self-pay | Admitting: Dermatology

## 2021-06-06 DIAGNOSIS — H93291 Other abnormal auditory perceptions, right ear: Secondary | ICD-10-CM | POA: Diagnosis not present

## 2021-06-06 DIAGNOSIS — M542 Cervicalgia: Secondary | ICD-10-CM | POA: Diagnosis not present

## 2021-06-06 DIAGNOSIS — H6123 Impacted cerumen, bilateral: Secondary | ICD-10-CM | POA: Diagnosis not present

## 2021-06-08 ENCOUNTER — Ambulatory Visit (INDEPENDENT_AMBULATORY_CARE_PROVIDER_SITE_OTHER): Payer: Medicare Other | Admitting: Internal Medicine

## 2021-06-08 ENCOUNTER — Encounter: Payer: Self-pay | Admitting: Internal Medicine

## 2021-06-08 ENCOUNTER — Other Ambulatory Visit: Payer: Self-pay

## 2021-06-08 DIAGNOSIS — I10 Essential (primary) hypertension: Secondary | ICD-10-CM

## 2021-06-08 DIAGNOSIS — I2510 Atherosclerotic heart disease of native coronary artery without angina pectoris: Secondary | ICD-10-CM | POA: Diagnosis not present

## 2021-06-08 DIAGNOSIS — E8881 Metabolic syndrome: Secondary | ICD-10-CM

## 2021-06-08 DIAGNOSIS — F419 Anxiety disorder, unspecified: Secondary | ICD-10-CM

## 2021-06-08 MED ORDER — DIAZEPAM 10 MG PO TABS: ORAL_TABLET | ORAL | 0 refills | Status: DC

## 2021-06-08 NOTE — Progress Notes (Signed)
Established Patient Office Visit  Subjective:  Patient ID: SHADIA LAROSE, female    DOB: 1946/06/18  Age: 75 y.o. MRN: [id number]  CC:  Chief Complaint  Patient presents with   Anxiety    Anxiety     NIVA MURREN presents for general checkup.,  She denies any chest pain or shortness of breath.  She does not smoke does not drink.  Past Medical History:  Diagnosis Date   Basal cell carcinoma 09/25/2007   right pretibial    Past Surgical History:  Procedure Laterality Date   CHOLECYSTECTOMY      History reviewed. No pertinent family history.  Social History   Socioeconomic History   Marital status: Married    Spouse name: Not on file   Number of children: Not on file   Years of education: Not on file   Highest education level: Not on file  Occupational History   Not on file  Tobacco Use   Smoking status: Never   Smokeless tobacco: Never  Vaping Use   Vaping Use: Never used  Substance and Sexual Activity   Alcohol use: Not Currently   Drug use: Never   Sexual activity: Not on file  Other Topics Concern   Not on file  Social History Narrative   Not on file   Social Determinants of Health   Financial Resource Strain: Not on file  Food Insecurity: Not on file  Transportation Needs: Not on file  Physical Activity: Not on file  Stress: Not on file  Social Connections: Not on file  Intimate Partner Violence: Not on file     Current Outpatient Medications:    albuterol (PROVENTIL) (2.5 MG/3ML) 0.083% nebulizer solution, Take 3 mLs (2.5 mg total) by nebulization every 6 (six) hours as needed for wheezing or shortness of breath., Disp: 75 mL, Rfl: 12   cetirizine (ZYRTEC) 10 MG tablet, TK 1 T PO BID, Disp: , Rfl:    doxycycline (VIBRA-TABS) 100 MG tablet, Take 1 tablet (100 mg total) by mouth 2 (two) times daily., Disp: 14 tablet, Rfl: 0   hydrOXYzine (ATARAX/VISTARIL) 25 MG tablet, Take 1 tablet (25 mg total) by mouth every 8 (eight) hours as needed.,  Disp: 30 tablet, Rfl: 0   omeprazole (PRILOSEC) 40 MG capsule, TAKE 1 CAPSULE(40 MG) BY MOUTH EVERY DAY, Disp: , Rfl:    pramipexole (MIRAPEX) 0.25 MG tablet, TAKE 1 TABLET BY MOUTH DAILY, Disp: 90 tablet, Rfl: 3   pramipexole (MIRAPEX) 0.25 MG tablet, TAKE 1 TABLET(0.25 MG) BY MOUTH THREE TIMES DAILY, Disp: 270 tablet, Rfl: 3   predniSONE (DELTASONE) 10 MG tablet, 50 mg daily x 3 days, then 40 mg daily x 3 days, then 30 mg daily x 3 days, then 20 mg daily x 3 days, then 10 mg daily x 3 days., Disp: 45 tablet, Rfl: 0   tamsulosin (FLOMAX) 0.4 MG CAPS capsule, Take 0.4 mg by mouth daily., Disp: , Rfl:    diazepam (VALIUM) 10 MG tablet, TAKE 1 TABLET(10 MG) BY MOUTH DAILY, Disp: 90 tablet, Rfl: 0   zolpidem (AMBIEN) 10 MG tablet, Take 1 tablet (10 mg total) by mouth at bedtime as needed for sleep., Disp: 30 tablet, Rfl: 2   Allergies  Allergen Reactions   Omeprazole Magnesium Other (See Comments)    Makes joints ache really badly    ROS Review of Systems  Constitutional: Negative.   HENT: Negative.    Eyes: Negative.   Respiratory: Negative.    Cardiovascular:  Negative.   Gastrointestinal: Negative.   Endocrine: Negative.   Genitourinary: Negative.   Musculoskeletal: Negative.   Skin: Negative.   Allergic/Immunologic: Negative.   Neurological: Negative.   Hematological: Negative.   Psychiatric/Behavioral: Negative.    All other systems reviewed and are negative.    Objective:    Physical Exam Vitals reviewed.  Constitutional:      Appearance: Normal appearance.  HENT:     Mouth/Throat:     Mouth: Mucous membranes are moist.  Eyes:     Pupils: Pupils are equal, round, and reactive to light.  Neck:     Vascular: No carotid bruit.  Cardiovascular:     Rate and Rhythm: Normal rate and regular rhythm.     Pulses: Normal pulses.     Heart sounds: Normal heart sounds.  Pulmonary:     Effort: Pulmonary effort is normal.     Breath sounds: Normal breath sounds.  Abdominal:      General: Bowel sounds are normal.     Palpations: Abdomen is soft. There is no hepatomegaly, splenomegaly or mass.     Tenderness: There is no abdominal tenderness.     Hernia: No hernia is present.  Musculoskeletal:        General: No tenderness.     Cervical back: Neck supple.     Right lower leg: No edema.     Left lower leg: No edema.  Skin:    Findings: No rash.  Neurological:     Mental Status: She is alert and oriented to person, place, and time.     Motor: No weakness.  Psychiatric:        Mood and Affect: Mood and affect normal.        Behavior: Behavior normal.    There were no vitals taken for this visit. Wt Readings from Last 3 Encounters:  03/08/21 160 lb 4.8 oz (72.7 kg)  12/20/20 174 lb (78.9 kg)  09/13/20 174 lb (78.9 kg)     Health Maintenance Due  Topic Date Due   COVID-19 Vaccine (1) Never done   Pneumonia Vaccine 53+ Years old (1 - PCV) Never done   Hepatitis C Screening  Never done   TETANUS/TDAP  Never done   Zoster Vaccines- Shingrix (1 of 2) Never done   DEXA SCAN  Never done   INFLUENZA VACCINE  Never done    There are no preventive care reminders to display for this patient.  No results found for: TSH No results found for: WBC, HGB, HCT, MCV, PLT No results found for: NA, K, CHLORIDE, CO2, GLUCOSE, BUN, CREATININE, BILITOT, ALKPHOS, AST, ALT, PROT, ALBUMIN, CALCIUM, ANIONGAP, EGFR, GFR No results found for: CHOL No results found for: HDL No results found for: LDLCALC No results found for: TRIG No results found for: CHOLHDL No results found for: HGBA1C    Assessment & Plan:   Problem List Items Addressed This Visit       Cardiovascular and Mediastinum   Essential hypertension     Patient denies any chest pain or shortness of breath there is no history of palpitation or paroxysmal nocturnal dyspnea   patient was advised to follow low-salt low-cholesterol diet    ideally I want to keep systolic blood pressure below 130 mmHg,  patient was asked to check blood pressure one times a week and give me a report on that.  Patient will be follow-up in 3 months  or earlier as needed, patient will call me back for any  change in the cardiovascular symptoms Patient was advised to buy a book from local bookstore concerning blood pressure and read several chapters  every day.  This will be supplemented by some of the material we will give him from the office.  Patient should also utilize other resources like YouTube and Internet to learn more about the blood pressure and the diet.        Other   Anxiety     Keeping a stress/anxiety diary. This can help you learn what triggers your reaction and then learn ways to manage your response.  Thinking about how you react to certain situations. You may not be able to control everything, but you can control your response.  Making time for activities that help you relax and not feeling guilty about spending your time in this way.  Visual imagery and yoga can help you stay calm and relax.      Relevant Medications   diazepam (VALIUM) 10 MG tablet   Metabolic syndrome - Primary    Behavioral modification strategies: increasing lean protein intake, decreasing simple carbohydrates, increasing vegetables, increasing water intake, decreasing eating out, no skipping meals, meal planning and cooking strategies, keeping healthy foods in the home and planning for success.       Meds ordered this encounter  Medications   diazepam (VALIUM) 10 MG tablet    Sig: TAKE 1 TABLET(10 MG) BY MOUTH DAILY    Dispense:  90 tablet    Refill:  0    Follow-up: No follow-ups on file.    Cletis Athens, MD

## 2021-06-08 NOTE — Assessment & Plan Note (Signed)
Behavioral modification strategies: increasing lean protein intake, decreasing simple carbohydrates, increasing vegetables, increasing water intake, decreasing eating out, no skipping meals, meal planning and cooking strategies, keeping healthy foods in the home and planning for success. 

## 2021-06-08 NOTE — Assessment & Plan Note (Signed)
   Keeping a stress/anxiety diary. This can help you learn what triggers your reaction and then learn ways to manage your response.  Thinking about how you react to certain situations. You may not be able to control everything, but you can control your response.  Making time for activities that help you relax and not feeling guilty about spending your time in this way.  Visual imagery and yoga can help you stay calm and relax. 

## 2021-06-08 NOTE — Assessment & Plan Note (Signed)

## 2021-07-15 ENCOUNTER — Ambulatory Visit (INDEPENDENT_AMBULATORY_CARE_PROVIDER_SITE_OTHER): Payer: Medicare Other

## 2021-07-15 DIAGNOSIS — Z0000 Encounter for general adult medical examination without abnormal findings: Secondary | ICD-10-CM | POA: Diagnosis not present

## 2021-07-15 MED ORDER — PRAMIPEXOLE DIHYDROCHLORIDE 0.25 MG PO TABS: 0.2500 mg | ORAL_TABLET | Freq: Every day | ORAL | 3 refills | Status: DC

## 2021-07-15 NOTE — Progress Notes (Signed)
Subjective:   Carrie Vargas is a 75 y.o. female who presents for Medicare Annual (Subsequent) preventive examination. I discussed the limitations of evaluation and management by telemedicine and the availability of in person appointments. The patient expressed understanding and agreed to proceed.   Visit performed by audio   Patient location: Home  Provider location: Home  Review of Systems    N/A       Objective:    There were no vitals filed for this visit. There is no height or weight on file to calculate BMI.  Advanced Directives 07/15/2021 02/22/2020  Does Patient Have a Medical Advance Directive? Yes No  Type of Advance Directive Living will;Healthcare Power of Attorney -  Does patient want to make changes to medical advance directive? No - Patient declined -    Current Medications (verified) Outpatient Encounter Medications as of 07/15/2021  Medication Sig   albuterol (PROVENTIL) (2.5 MG/3ML) 0.083% nebulizer solution Take 3 mLs (2.5 mg total) by nebulization every 6 (six) hours as needed for wheezing or shortness of breath.   cetirizine (ZYRTEC) 10 MG tablet TK 1 T PO BID   diazepam (VALIUM) 10 MG tablet TAKE 1 TABLET(10 MG) BY MOUTH DAILY   doxycycline (VIBRA-TABS) 100 MG tablet Take 1 tablet (100 mg total) by mouth 2 (two) times daily.   hydrOXYzine (ATARAX/VISTARIL) 25 MG tablet Take 1 tablet (25 mg total) by mouth every 8 (eight) hours as needed.   omeprazole (PRILOSEC) 40 MG capsule TAKE 1 CAPSULE(40 MG) BY MOUTH EVERY DAY   pramipexole (MIRAPEX) 0.25 MG tablet TAKE 1 TABLET(0.25 MG) BY MOUTH THREE TIMES DAILY   predniSONE (DELTASONE) 10 MG tablet 50 mg daily x 3 days, then 40 mg daily x 3 days, then 30 mg daily x 3 days, then 20 mg daily x 3 days, then 10 mg daily x 3 days.   tamsulosin (FLOMAX) 0.4 MG CAPS capsule Take 0.4 mg by mouth daily.   [DISCONTINUED] pramipexole (MIRAPEX) 0.25 MG tablet TAKE 1 TABLET BY MOUTH DAILY   pramipexole (MIRAPEX) 0.25 MG  tablet Take 1 tablet (0.25 mg total) by mouth daily.   zolpidem (AMBIEN) 10 MG tablet Take 1 tablet (10 mg total) by mouth at bedtime as needed for sleep.   [DISCONTINUED] fluticasone (FLONASE) 50 MCG/ACT nasal spray Place into the nose.   No facility-administered encounter medications on file as of 07/15/2021.    Allergies (verified) Omeprazole magnesium   History: Past Medical History:  Diagnosis Date   Basal cell carcinoma 09/25/2007   right pretibial   Past Surgical History:  Procedure Laterality Date   CHOLECYSTECTOMY     History reviewed. No pertinent family history. Social History   Socioeconomic History   Marital status: Married    Spouse name: Not on file   Number of children: Not on file   Years of education: Not on file   Highest education level: 12th grade  Occupational History   Occupation: Retired  Tobacco Use   Smoking status: Never   Smokeless tobacco: Never  Vaping Use   Vaping Use: Never used  Substance and Sexual Activity   Alcohol use: Not Currently   Drug use: Never   Sexual activity: Yes  Other Topics Concern   Not on file  Social History Narrative   Not on file   Social Determinants of Health   Financial Resource Strain: Low Risk    Difficulty of Paying Living Expenses: Not hard at all  Food Insecurity: No Food Insecurity  Worried About Charity fundraiser in the Last Year: Never true   [place] in the Last Year: Never true  Transportation Needs: No Transportation Needs   Lack of Transportation (Medical): No   Lack of Transportation (Non-Medical): No  Physical Activity: Sufficiently Active   Days of Exercise per Week: 5 days   Minutes of Exercise per Session: 120 min  Stress: No Stress Concern Present   Feeling of Stress : Not at all  Social Connections: Socially Integrated   Frequency of Communication with Friends and Family: More than three times a week   Frequency of Social Gatherings with Friends and Family: More than  three times a week   Attends Religious Services: More than 4 times per year   Active Member of [employer] or Organizations: Yes   Attends Music therapist: More than 4 times per year   Marital Status: Married    Tobacco Counseling Counseling given: Not Answered   Clinical Intake:  Pre-visit preparation completed: Yes  Pain : No/denies pain     Diabetes: No  How often do you need to have someone help you when you read instructions, pamphlets, or other written materials from your doctor or pharmacy?: 1 - Never What is the last grade level you completed in school?: 12 th grade  Diabetic?No  Interpreter Needed?: No  Information entered by :: Anson [place] CMA   Activities of Daily Living In your present state of health, do you have any difficulty performing the following activities: 07/15/2021  Hearing? N  Vision? N  Difficulty concentrating or making decisions? N  Walking or climbing stairs? N  Dressing or bathing? N  Doing errands, shopping? N  Preparing Food and eating ? N  Using the Toilet? N  In the past six months, have you accidently leaked urine? N  Do you have problems with loss of bowel control? N  Managing your Medications? N  Managing your Finances? N  Housekeeping or managing your Housekeeping? N  Some recent data might be hidden    Patient Care Team: Cletis Athens, MD as PCP - General (Internal Medicine)  Indicate any recent Medical Services you may have received from other than [facility] providers in the past year (date may be approximate).     Assessment:   This is a routine wellness examination for [place].  Hearing/Vision screen No results found.  Dietary issues and exercise activities discussed: Current Exercise Habits: Home exercise routine;Structured exercise class, Type of exercise: strength training/weights;treadmill;walking;stretching, Time (Minutes): 60, Frequency (Times/Week): 5, Weekly Exercise (Minutes/Week): 300, Intensity:  Moderate   Goals Addressed   None    Depression Screen PHQ 2/9 Scores 07/15/2021 03/08/2021  PHQ - 2 Score 0 0    Fall Risk Fall Risk  07/15/2021 03/08/2021 06/14/2018  Falls in the past year? 0 0 0  Comment - - Emmi Telephone Survey: data to providers prior to load  Number falls in past yr: 0 0 -  Injury with Fall? 0 0 -  Risk for fall due to : No Fall Risks No Fall Risks -  Follow up Falls evaluation completed Falls evaluation completed -    FALL RISK PREVENTION PERTAINING TO THE HOME:  Any stairs in or around the home? Yes  If so, are there any without handrails? No  Home free of loose throw rugs in walkways, pet beds, electrical cords, etc? Yes  Adequate lighting in your home to reduce risk of falls? Yes   ASSISTIVE DEVICES UTILIZED  TO PREVENT FALLS:  Life alert? No  Use of a cane, walker or w/c? No  Grab bars in the bathroom? No  Shower chair or bench in shower? No  Elevated toilet seat or a handicapped toilet? No   TIMED UP AND GO:  Was the test performed? No .  Length of time to ambulate 10 feet: 0 sec.     Cognitive Function:     6CIT Screen 07/15/2021  What Year? 0 points  What month? 0 points  What time? 0 points  Count back from 20 0 points  Months in reverse 0 points  Repeat phrase 0 points  Total Score 0    Immunizations  There is no immunization history on file for this patient.  TDAP status: Due, Education has been provided regarding the importance of this vaccine. Advised may receive this vaccine at local pharmacy or Health Dept. Aware to provide a copy of the vaccination record if obtained from local pharmacy or Health Dept. Verbalized acceptance and understanding.  Flu Vaccine status: Declined, Education has been provided regarding the importance of this vaccine but patient still declined. Advised may receive this vaccine at local pharmacy or Health Dept. Aware to provide a copy of the vaccination record if obtained from local pharmacy or  Health Dept. Verbalized acceptance and understanding.  Pneumococcal vaccine status: Declined,  Education has been provided regarding the importance of this vaccine but patient still declined. Advised may receive this vaccine at local pharmacy or Health Dept. Aware to provide a copy of the vaccination record if obtained from local pharmacy or Health Dept. Verbalized acceptance and understanding.   Covid-19 vaccine status: Declined, Education has been provided regarding the importance of this vaccine but patient still declined. Advised may receive this vaccine at local pharmacy or Health Dept.or vaccine clinic. Aware to provide a copy of the vaccination record if obtained from local pharmacy or Health Dept. Verbalized acceptance and understanding.  Qualifies for Shingles Vaccine? Yes   Zostavax completed No   Shingrix Completed?: No.    Education has been provided regarding the importance of this vaccine. Patient has been advised to call insurance company to determine out of pocket expense if they have not yet received this vaccine. Advised may also receive vaccine at local pharmacy or Health Dept. Verbalized acceptance and understanding.  Screening Tests Health Maintenance  Topic Date Due   Hepatitis C Screening  Never done   DEXA SCAN  Never done   COVID-19 Vaccine (1) 07/31/2021 (Originally 02/23/1946)   Zoster Vaccines- Shingrix (1 of 2) 10/13/2021 (Originally 08/25/1964)   INFLUENZA VACCINE  10/28/2021 (Originally 02/28/2021)   Pneumonia Vaccine 22+ Years old (1 - PCV) 07/15/2022 (Originally 08/25/1951)   TETANUS/TDAP  07/15/2022 (Originally 08/25/1964)   HPV VACCINES  Aged Out    Health Maintenance  Health Maintenance Due  Topic Date Due   Hepatitis C Screening  Never done   DEXA SCAN  Never done    Colonoscopy:   Mammogram status: Ordered Yes. Pt provided with contact info and advised to call to schedule appt.     Lung Cancer Screening: (Low Dose CT Chest recommended if Age 27-80  years, 30 pack-year currently smoking OR have quit w/in 15years.) does not qualify.   Lung Cancer Screening Referral: No  Additional Screening:  Hepatitis C Screening: does not qualify; Completed No  Vision Screening: Recommended annual ophthalmology exams for early detection of glaucoma and other disorders of the eye. Is the patient up to date with their  annual eye exam?  No  Who is the provider or what is the name of the office in which the patient attends annual eye exams? [facility] If pt is not established with a provider, would they like to be referred to a provider to establish care? No .   Dental Screening: Recommended annual dental exams for proper oral hygiene  Community Resource Referral / Chronic Care Management: CRR required this visit?  No   CCM required this visit?  No      Plan:     I have personally reviewed and noted the following in the patients chart:   Medical and social history Use of alcohol, tobacco or illicit drugs  Current medications and supplements including opioid prescriptions.  Functional ability and status Nutritional status Physical activity Advanced directives List of other physicians Hospitalizations, surgeries, and ER visits in previous 12 months Vitals Screenings to include cognitive, depression, and falls Referrals and appointments  In addition, I have reviewed and discussed with patient certain preventive protocols, quality metrics, and best practice recommendations. A written personalized care plan for preventive services as well as general preventive health recommendations were provided to patient.    Ms. Grassi , Thank you for taking time to come for your Medicare Wellness Visit. I appreciate your ongoing commitment to your health goals. Please review the following plan we discussed and let me know if I can assist you in the future.   These are the goals we discussed:  Goals   None     This is a list of the screening  recommended for you and due dates:  Health Maintenance  Topic Date Due   Hepatitis C Screening: USPSTF Recommendation to screen - Ages 69-79 yo.  Never done   DEXA scan (bone density measurement)  Never done   COVID-19 Vaccine (1) 07/31/2021*   Zoster (Shingles) Vaccine (1 of 2) 10/13/2021*   Flu Shot  10/28/2021*   Pneumonia Vaccine (1 - PCV) 07/15/2022*   Tetanus Vaccine  07/15/2022*   HPV Vaccine  Aged Out  *Topic was postponed. The date shown is not the original due date.     Renato Gails, [place]   07/15/2021   Nurse Notes: Patient informed of care gaps, Mrs. Woolston has declined all vaccines. Medication refill was sent in today. Patient is scheduled to have a mammogram in February through [facility]. Pt would like to discuss repeating colonoscopy or referral for GI with Dr. Lavera Guise at next visit due to some discomfort she is having.

## 2021-07-15 NOTE — Progress Notes (Signed)
I have reviewed this visit and agree with the documentation.   

## 2021-08-15 ENCOUNTER — Other Ambulatory Visit: Payer: Self-pay | Admitting: *Deleted

## 2021-08-15 DIAGNOSIS — F419 Anxiety disorder, unspecified: Secondary | ICD-10-CM

## 2021-08-15 MED ORDER — DIAZEPAM 10 MG PO TABS: ORAL_TABLET | ORAL | 0 refills | Status: DC

## 2021-09-10 ENCOUNTER — Other Ambulatory Visit: Payer: Self-pay | Admitting: Internal Medicine

## 2021-09-10 DIAGNOSIS — F419 Anxiety disorder, unspecified: Secondary | ICD-10-CM

## 2021-09-13 ENCOUNTER — Other Ambulatory Visit: Payer: Self-pay

## 2021-09-13 ENCOUNTER — Other Ambulatory Visit: Payer: Self-pay | Admitting: Internal Medicine

## 2021-09-13 ENCOUNTER — Ambulatory Visit (INDEPENDENT_AMBULATORY_CARE_PROVIDER_SITE_OTHER): Payer: Medicare Other | Admitting: Dermatology

## 2021-09-13 DIAGNOSIS — L820 Inflamed seborrheic keratosis: Secondary | ICD-10-CM

## 2021-09-13 DIAGNOSIS — L578 Other skin changes due to chronic exposure to nonionizing radiation: Secondary | ICD-10-CM

## 2021-09-13 DIAGNOSIS — F419 Anxiety disorder, unspecified: Secondary | ICD-10-CM

## 2021-09-13 DIAGNOSIS — L821 Other seborrheic keratosis: Secondary | ICD-10-CM

## 2021-09-13 NOTE — Progress Notes (Signed)
° °  Follow-Up Visit   Subjective  Carrie Vargas is a 76 y.o. female who presents for the following: Other (Spot of left scalp).  The following portions of the chart were reviewed this encounter and updated as appropriate:   Tobacco   Allergies   Meds   Problems   Med Hx   Surg Hx   Fam Hx      Review of Systems:  No other skin or systemic complaints except as noted in HPI or Assessment and Plan.  Objective  Well appearing patient in no apparent distress; mood and affect are within normal limits.  A focused examination was performed including scalp. Relevant physical exam findings are noted in the Assessment and Plan.  Left scalp Erythematous stuck-on, waxy papule or plaque   Assessment & Plan   Actinic Damage - chronic, secondary to cumulative UV radiation exposure/sun exposure over time - diffuse scaly erythematous macules with underlying dyspigmentation - Recommend daily broad spectrum sunscreen SPF 30+ to sun-exposed areas, reapply every 2 hours as needed.  - Recommend staying in the shade or wearing long sleeves, sun glasses (UVA+UVB protection) and wide brim hats (4-inch brim around the entire circumference of the hat). - Call for new or changing lesions.  Seborrheic Keratoses - Stuck-on, waxy, tan-brown papules and/or plaques  - Benign-appearing - Discussed benign etiology and prognosis. - Observe - Call for any changes  Inflamed seborrheic keratosis Left scalp  Destruction of lesion - Left scalp Complexity: simple   Destruction method: cryotherapy   Informed consent: discussed and consent obtained   Timeout:  patient name, date of birth, surgical site, and procedure verified Lesion destroyed using liquid nitrogen: Yes   Region frozen until ice ball extended beyond lesion: Yes   Outcome: patient tolerated procedure well with no complications   Post-procedure details: wound care instructions given     Return if symptoms worsen or fail to improve.  I, Ashok Cordia, CMA, am acting as scribe for Sarina Ser, MD . Documentation: I have reviewed the above documentation for accuracy and completeness, and I agree with the above.  Sarina Ser, MD

## 2021-09-13 NOTE — Patient Instructions (Signed)

## 2021-09-14 ENCOUNTER — Encounter: Payer: Self-pay | Admitting: Internal Medicine

## 2021-09-14 ENCOUNTER — Ambulatory Visit (INDEPENDENT_AMBULATORY_CARE_PROVIDER_SITE_OTHER): Payer: Medicare Other | Admitting: Internal Medicine

## 2021-09-14 ENCOUNTER — Encounter: Payer: Self-pay | Admitting: Dermatology

## 2021-09-14 VITALS — BP 103/58 | HR 88 | Ht 65.0 in | Wt 160.0 lb

## 2021-09-14 DIAGNOSIS — E8881 Metabolic syndrome: Secondary | ICD-10-CM

## 2021-09-14 DIAGNOSIS — I10 Essential (primary) hypertension: Secondary | ICD-10-CM

## 2021-09-14 DIAGNOSIS — H6123 Impacted cerumen, bilateral: Secondary | ICD-10-CM | POA: Diagnosis not present

## 2021-09-14 DIAGNOSIS — F419 Anxiety disorder, unspecified: Secondary | ICD-10-CM | POA: Diagnosis not present

## 2021-09-14 NOTE — Assessment & Plan Note (Signed)
Patient was advised to lose weight 

## 2021-09-14 NOTE — Assessment & Plan Note (Signed)
Blood pressure stable ? ?

## 2021-09-14 NOTE — Assessment & Plan Note (Signed)
Refer to ENT

## 2021-09-14 NOTE — Progress Notes (Signed)
Established Patient Office Visit  Subjective:  Patient ID: Carrie Vargas, female    DOB: 1945/11/07  Age: 76 y.o. MRN: [id number]  CC: No chief complaint on file.   HPI  Carrie Vargas presents for wax in both ears  Past Medical History:  Diagnosis Date   Basal cell carcinoma 09/25/2007   right pretibial    Past Surgical History:  Procedure Laterality Date   CHOLECYSTECTOMY      History reviewed. No pertinent family history.  Social History   Socioeconomic History   Marital status: Married    Spouse name: Not on file   Number of children: Not on file   Years of education: Not on file   Highest education level: 12th grade  Occupational History   Occupation: Retired  Tobacco Use   Smoking status: Never   Smokeless tobacco: Never  Vaping Use   Vaping Use: Never used  Substance and Sexual Activity   Alcohol use: Not Currently   Drug use: Never   Sexual activity: Yes  Other Topics Concern   Not on file  Social History Narrative   Not on file   Social Determinants of Health   Financial Resource Strain: Low Risk    Difficulty of Paying Living Expenses: Not hard at all  Food Insecurity: No Food Insecurity   Worried About Charity fundraiser in the Last Year: Never true   [place] in the Last Year: Never true  Transportation Needs: No Transportation Needs   Lack of Transportation (Medical): No   Lack of Transportation (Non-Medical): No  Physical Activity: Sufficiently Active   Days of Exercise per Week: 5 days   Minutes of Exercise per Session: 120 min  Stress: No Stress Concern Present   Feeling of Stress : Not at all  Social Connections: Socially Integrated   Frequency of Communication with Friends and Family: More than three times a week   Frequency of Social Gatherings with Friends and Family: More than three times a week   Attends Religious Services: More than 4 times per year   Active Member of [employer] or Organizations: Yes   Attends Programme researcher, broadcasting/film/video: More than 4 times per year   Marital Status: Married  Human resources officer Violence: Not At Risk   Fear of Current or Ex-Partner: No   Emotionally Abused: No   Physically Abused: No   Sexually Abused: No     Current Outpatient Medications:    albuterol (PROVENTIL) (2.5 MG/3ML) 0.083% nebulizer solution, Take 3 mLs (2.5 mg total) by nebulization every 6 (six) hours as needed for wheezing or shortness of breath., Disp: 75 mL, Rfl: 12   cetirizine (ZYRTEC) 10 MG tablet, TK 1 T PO BID, Disp: , Rfl:    diazepam (VALIUM) 10 MG tablet, TAKE 1 TABLET(10 MG) BY MOUTH DAILY, Disp: 90 tablet, Rfl: 0   doxycycline (VIBRA-TABS) 100 MG tablet, Take 1 tablet (100 mg total) by mouth 2 (two) times daily., Disp: 14 tablet, Rfl: 0   hydrOXYzine (ATARAX/VISTARIL) 25 MG tablet, Take 1 tablet (25 mg total) by mouth every 8 (eight) hours as needed., Disp: 30 tablet, Rfl: 0   omeprazole (PRILOSEC) 40 MG capsule, TAKE 1 CAPSULE(40 MG) BY MOUTH EVERY DAY, Disp: , Rfl:    pramipexole (MIRAPEX) 0.25 MG tablet, TAKE 1 TABLET(0.25 MG) BY MOUTH THREE TIMES DAILY, Disp: 270 tablet, Rfl: 3   pramipexole (MIRAPEX) 0.25 MG tablet, Take 1 tablet (0.25 mg total) by mouth  daily., Disp: 90 tablet, Rfl: 3   predniSONE (DELTASONE) 10 MG tablet, 50 mg daily x 3 days, then 40 mg daily x 3 days, then 30 mg daily x 3 days, then 20 mg daily x 3 days, then 10 mg daily x 3 days., Disp: 45 tablet, Rfl: 0   tamsulosin (FLOMAX) 0.4 MG CAPS capsule, Take 0.4 mg by mouth daily., Disp: , Rfl:    zolpidem (AMBIEN) 10 MG tablet, TAKE 1 TABLET BY MOUTH EVERY NIGHT AT BEDTIME AS NEEDED FOR SLEEP, Disp: 30 tablet, Rfl: 0   Allergies  Allergen Reactions   Omeprazole Magnesium Other (See Comments)    Makes joints ache really badly    ROS Review of Systems  Constitutional: Negative.   HENT: Negative.    Eyes: Negative.   Respiratory: Negative.    Cardiovascular: Negative.   Gastrointestinal: Negative.   Endocrine:  Negative.   Genitourinary: Negative.   Musculoskeletal: Negative.   Skin: Negative.   Allergic/Immunologic: Negative.   Neurological: Negative.   Hematological: Negative.   Psychiatric/Behavioral: Negative.    All other systems reviewed and are negative.    Objective:    Physical Exam Vitals reviewed.  Constitutional:      Appearance: Normal appearance.  HENT:     Mouth/Throat:     Mouth: Mucous membranes are moist.  Eyes:     Pupils: Pupils are equal, round, and reactive to light.  Neck:     Vascular: No carotid bruit.  Cardiovascular:     Rate and Rhythm: Normal rate and regular rhythm.     Pulses: Normal pulses.     Heart sounds: Normal heart sounds.  Pulmonary:     Effort: Pulmonary effort is normal.     Breath sounds: Normal breath sounds.  Abdominal:     General: Bowel sounds are normal.     Palpations: Abdomen is soft. There is no hepatomegaly, splenomegaly or mass.     Tenderness: There is no abdominal tenderness.     Hernia: No hernia is present.  Musculoskeletal:        General: No tenderness.     Cervical back: Neck supple.     Right lower leg: No edema.     Left lower leg: No edema.  Skin:    Findings: No rash.  Neurological:     Mental Status: She is alert and oriented to person, place, and time.     Motor: No weakness.  Psychiatric:        Mood and Affect: Mood and affect normal.        Behavior: Behavior normal.    BP (!) 103/58    Pulse 88    Ht 5' 5" (1.651 m)    Wt 160 lb (72.6 kg)    BMI 26.63 kg/m  Wt Readings from Last 3 Encounters:  09/14/21 160 lb (72.6 kg)  03/08/21 160 lb 4.8 oz (72.7 kg)  12/20/20 174 lb (78.9 kg)     Health Maintenance Due  Topic Date Due   COVID-19 Vaccine (1) Never done   Hepatitis C Screening  Never done   DEXA SCAN  Never done    There are no preventive care reminders to display for this patient.  No results found for: TSH No results found for: WBC, HGB, HCT, MCV, PLT No results found for: NA, K,  CHLORIDE, CO2, GLUCOSE, BUN, CREATININE, BILITOT, ALKPHOS, AST, ALT, PROT, ALBUMIN, CALCIUM, ANIONGAP, EGFR, GFR No results found for: CHOL No results found for: HDL No  results found for: LDLCALC No results found for: TRIG No results found for: CHOLHDL No results found for: HGBA1C    Assessment & Plan:   Problem List Items Addressed This Visit       Cardiovascular and Mediastinum   Essential hypertension    Blood pressure stable        Nervous and Auditory   Excessive cerumen in both ear canals - Primary    Refer to ENT      Relevant Orders   Ambulatory referral to ENT     Other   Anxiety    - Patient experiencing high levels of anxiety.  - Encouraged patient to engage in relaxing activities like yoga, meditation, journaling, going for a walk, or participating in a hobby.  - Encouraged patient to reach out to trusted friends or family members about recent struggles, Patient was advised to read A book, how to stop worrying and start living, it is good book to read to control  the stress       Metabolic syndrome    Patient was advised to lose weight       No orders of the defined types were placed in this encounter.   Follow-up: No follow-ups on file.    Cletis Athens, MD

## 2021-09-14 NOTE — Assessment & Plan Note (Signed)
-   Patient experiencing high levels of anxiety.  - Encouraged patient to engage in relaxing activities like yoga, meditation, journaling, going for a walk, or participating in a hobby.  - Encouraged patient to reach out to trusted friends or family members about recent struggles, Patient was advised to read A book, how to stop worrying and start living, it is good book to read to control  the stress  

## 2021-09-24 DIAGNOSIS — Z1231 Encounter for screening mammogram for malignant neoplasm of breast: Secondary | ICD-10-CM | POA: Diagnosis not present

## 2021-09-26 DIAGNOSIS — J301 Allergic rhinitis due to pollen: Secondary | ICD-10-CM | POA: Diagnosis not present

## 2021-09-26 DIAGNOSIS — H6123 Impacted cerumen, bilateral: Secondary | ICD-10-CM | POA: Diagnosis not present

## 2021-11-21 ENCOUNTER — Ambulatory Visit (INDEPENDENT_AMBULATORY_CARE_PROVIDER_SITE_OTHER): Payer: Medicare Other | Admitting: Internal Medicine

## 2021-11-21 ENCOUNTER — Encounter: Payer: Self-pay | Admitting: Internal Medicine

## 2021-11-21 VITALS — BP 118/57 | HR 87 | Ht 65.0 in | Wt 158.0 lb

## 2021-11-21 DIAGNOSIS — E8881 Metabolic syndrome: Secondary | ICD-10-CM | POA: Diagnosis not present

## 2021-11-21 DIAGNOSIS — Z20822 Contact with and (suspected) exposure to covid-19: Secondary | ICD-10-CM | POA: Insufficient documentation

## 2021-11-21 DIAGNOSIS — F419 Anxiety disorder, unspecified: Secondary | ICD-10-CM

## 2021-11-21 DIAGNOSIS — I10 Essential (primary) hypertension: Secondary | ICD-10-CM

## 2021-11-21 DIAGNOSIS — J069 Acute upper respiratory infection, unspecified: Secondary | ICD-10-CM | POA: Diagnosis not present

## 2021-11-21 LAB — POC COVID19 BINAXNOW: SARS Coronavirus 2 Ag: NEGATIVE

## 2021-11-21 MED ORDER — FLUTICASONE PROPIONATE 50 MCG/ACT NA SUSP: 2.0000 | Freq: Every day | NASAL | 6 refills | Status: AC

## 2021-11-21 MED ORDER — DIAZEPAM 10 MG PO TABS: ORAL_TABLET | ORAL | 0 refills | Status: DC

## 2021-11-21 MED ORDER — AMOXICILLIN-POT CLAVULANATE 875-125 MG PO TABS: 1.0000 | ORAL_TABLET | Freq: Two times a day (BID) | ORAL | 0 refills | Status: DC

## 2021-11-21 NOTE — Assessment & Plan Note (Signed)
Patient has an upper respiratory infection.  Throat is congested sinuses are congested there is no lymphadenopathy chest is congested with few rhonchi.  Patient was started on Augmentin ?

## 2021-11-21 NOTE — Assessment & Plan Note (Signed)
Patient is negative for COVID ?

## 2021-11-21 NOTE — Assessment & Plan Note (Signed)
-   Patient experiencing high levels of anxiety.  - Encouraged patient to engage in relaxing activities like yoga, meditation, journaling, going for a walk, or participating in a hobby.  - Encouraged patient to reach out to trusted friends or family members about recent struggles, Patient was advised to read A book, how to stop worrying and start living, it is good book to read to control  the stress  

## 2021-11-21 NOTE — Assessment & Plan Note (Signed)

## 2021-11-21 NOTE — Assessment & Plan Note (Signed)

## 2021-11-21 NOTE — Progress Notes (Signed)
? ?Established Patient Office Visit ? ?Subjective:  ?Patient ID: Carrie Vargas, female    DOB: 1945-12-10  Age: 76 y.o. MRN: [id number] ? ?CC:  ?Chief Complaint  ?Patient presents with  ? URI  ?  Patient complains of headache, sore throat, ears feel full.  ? ? ?URI  ? ? ?Bertram Millard presents for uri ? ?Past Medical History:  ?Diagnosis Date  ? Basal cell carcinoma 09/25/2007  ? right pretibial  ? ? ?Past Surgical History:  ?Procedure Laterality Date  ? CHOLECYSTECTOMY    ? ? ?History reviewed. No pertinent family history. ? ?Social History  ? ?Socioeconomic History  ? Marital status: Married  ?  Spouse name: Not on file  ? Number of children: Not on file  ? Years of education: Not on file  ? Highest education level: 12th grade  ?Occupational History  ? Occupation: Retired  ?Tobacco Use  ? Smoking status: Never  ? Smokeless tobacco: Never  ?Vaping Use  ? Vaping Use: Never used  ?Substance and Sexual Activity  ? Alcohol use: Not Currently  ? Drug use: Never  ? Sexual activity: Yes  ?Other Topics Concern  ? Not on file  ?Social History Narrative  ? Not on file  ? ?Social Determinants of Health  ? ?Financial Resource Strain: Low Risk   ? Difficulty of Paying Living Expenses: Not hard at all  ?Food Insecurity: No Food Insecurity  ? Worried About Charity fundraiser in the Last Year: Never true  ? Ran Out of Food in the Last Year: Never true  ?Transportation Needs: No Transportation Needs  ? Lack of Transportation (Medical): No  ? Lack of Transportation (Non-Medical): No  ?Physical Activity: Sufficiently Active  ? Days of Exercise per Week: 5 days  ? Minutes of Exercise per Session: 120 min  ?Stress: No Stress Concern Present  ? Feeling of Stress : Not at all  ?Social Connections: Socially Integrated  ? Frequency of Communication with Friends and Family: More than three times a week  ? Frequency of Social Gatherings with Friends and Family: More than three times a week  ? Attends Religious Services: More than 4 times per  year  ? Active Member of Clubs or Organizations: Yes  ? Attends Archivist Meetings: More than 4 times per year  ? Marital Status: Married  ?Intimate Partner Violence: Not At Risk  ? Fear of Current or Ex-Partner: No  ? Emotionally Abused: No  ? Physically Abused: No  ? Sexually Abused: No  ? ? ? ?Current Outpatient Medications:  ?  albuterol (PROVENTIL) (2.5 MG/3ML) 0.083% nebulizer solution, Take 3 mLs (2.5 mg total) by nebulization every 6 (six) hours as needed for wheezing or shortness of breath., Disp: 75 mL, Rfl: 12 ?  amoxicillin-clavulanate (AUGMENTIN) 875-125 MG tablet, Take 1 tablet by mouth 2 (two) times daily., Disp: 20 tablet, Rfl: 0 ?  cetirizine (ZYRTEC) 10 MG tablet, TK 1 T PO BID, Disp: , Rfl:  ?  doxycycline (VIBRA-TABS) 100 MG tablet, Take 1 tablet (100 mg total) by mouth 2 (two) times daily., Disp: 14 tablet, Rfl: 0 ?  fluticasone (FLONASE) 50 MCG/ACT nasal spray, Place 2 sprays into both nostrils daily., Disp: 16 g, Rfl: 6 ?  hydrOXYzine (ATARAX/VISTARIL) 25 MG tablet, Take 1 tablet (25 mg total) by mouth every 8 (eight) hours as needed., Disp: 30 tablet, Rfl: 0 ?  omeprazole (PRILOSEC) 40 MG capsule, TAKE 1 CAPSULE(40 MG) BY MOUTH EVERY DAY, Disp: ,  Rfl:  ?  pramipexole (MIRAPEX) 0.25 MG tablet, TAKE 1 TABLET(0.25 MG) BY MOUTH THREE TIMES DAILY, Disp: 270 tablet, Rfl: 3 ?  pramipexole (MIRAPEX) 0.25 MG tablet, Take 1 tablet (0.25 mg total) by mouth daily., Disp: 90 tablet, Rfl: 3 ?  tamsulosin (FLOMAX) 0.4 MG CAPS capsule, Take 0.4 mg by mouth daily., Disp: , Rfl:  ?  zolpidem (AMBIEN) 10 MG tablet, TAKE 1 TABLET BY MOUTH EVERY NIGHT AT BEDTIME AS NEEDED FOR SLEEP, Disp: 30 tablet, Rfl: 0 ?  diazepam (VALIUM) 10 MG tablet, TAKE 1 TABLET(10 MG) BY MOUTH DAILY, Disp: 90 tablet, Rfl: 0  ? ?Allergies  ?Allergen Reactions  ? Omeprazole Magnesium Other (See Comments)  ?  Makes joints ache really badly  ? ? ?ROS ?Review of Systems  ?Constitutional: Negative.   ?HENT: Negative.    ?Eyes:  Negative.   ?Respiratory: Negative.    ?Cardiovascular: Negative.   ?Gastrointestinal: Negative.   ?Endocrine: Negative.   ?Genitourinary: Negative.   ?Musculoskeletal: Negative.   ?Skin: Negative.   ?Allergic/Immunologic: Negative.   ?Neurological: Negative.   ?Hematological: Negative.   ?Psychiatric/Behavioral: Negative.    ?All other systems reviewed and are negative. ? ?  ?Objective:  ?  ?Physical Exam ?Vitals reviewed.  ?Constitutional:   ?   Appearance: Normal appearance.  ?HENT:  ?   Mouth/Throat:  ?   Mouth: Mucous membranes are moist.  ?Eyes:  ?   Pupils: Pupils are equal, round, and reactive to light.  ?Neck:  ?   Vascular: No carotid bruit.  ?Cardiovascular:  ?   Rate and Rhythm: Normal rate and regular rhythm.  ?   Pulses: Normal pulses.  ?   Heart sounds: Normal heart sounds.  ?Pulmonary:  ?   Effort: Pulmonary effort is normal.  ?   Breath sounds: Normal breath sounds.  ?Abdominal:  ?   General: Bowel sounds are normal.  ?   Palpations: Abdomen is soft. There is no hepatomegaly, splenomegaly or mass.  ?   Tenderness: There is no abdominal tenderness.  ?   Hernia: No hernia is present.  ?Musculoskeletal:     ?   General: No tenderness.  ?   Cervical back: Neck supple.  ?   Right lower leg: No edema.  ?   Left lower leg: No edema.  ?Skin: ?   Findings: No rash.  ?Neurological:  ?   Mental Status: She is alert and oriented to person, place, and time.  ?   Motor: No weakness.  ?Psychiatric:     ?   Mood and Affect: Mood and affect normal.     ?   Behavior: Behavior normal.  ? ? ?BP (!) 118/57   Pulse 87   Ht _0  (1.651 m)   Wt 158 lb (71.7 kg)   BMI 26.29 kg/m?  ?Wt Readings from Last 3 Encounters:  ?11/21/21 158 lb (71.7 kg)  ?09/14/21 160 lb (72.6 kg)  ?03/08/21 160 lb 4.8 oz (72.7 kg)  ? ? ? ?Health Maintenance Due  ?Topic Date Due  ? COVID-19 Vaccine (1) Never done  ? Hepatitis C Screening  Never done  ? Zoster Vaccines- Shingrix (1 of 2) Never done  ? DEXA SCAN  Never done  ? ? ?There are no  preventive care reminders to display for this patient. ? ?No results found for: TSH ?No results found for: WBC, HGB, HCT, MCV, PLT ?No results found for: NA, K, CHLORIDE, CO2, GLUCOSE, BUN, CREATININE, BILITOT, ALKPHOS, AST, ALT,  PROT, ALBUMIN, CALCIUM, ANIONGAP, EGFR, GFR ?No results found for: CHOL ?No results found for: HDL ?No results found for: [place] ?No results found for: TRIG ?No results found for: CHOLHDL ?No results found for: HGBA1C ? ?  ?Assessment & Plan:  ? ?Problem List Items Addressed This Visit   ? ?  ? Cardiovascular and Mediastinum  ? Essential hypertension  ?   Patient denies any chest pain or shortness of breath there is no history of palpitation or paroxysmal nocturnal dyspnea ?  patient was advised to follow low-salt low-cholesterol diet ? ?  ideally I want to keep systolic blood pressure below 130 mmHg, patient was asked to check blood pressure one times a week and give me a report on that.  Patient will be follow-up in 3 months  or earlier as needed, patient will call me back for any change in the cardiovascular symptoms ?Patient was advised to buy a book from local bookstore concerning blood pressure and read several chapters  every day.  This will be supplemented by some of the material we will give him from the office.  Patient should also utilize other resources like YouTube and Internet to learn more about the blood pressure and the diet. ? ?  ?  ?  ? Respiratory  ? Upper respiratory tract infection  ?  Patient has an upper respiratory infection.  Throat is congested sinuses are congested there is no lymphadenopathy chest is congested with few rhonchi.  Patient was started on Augmentin ? ?  ?  ? Relevant Medications  ? amoxicillin-clavulanate (AUGMENTIN) 875-125 MG tablet  ? fluticasone (FLONASE) 50 MCG/ACT nasal spray  ? Other Relevant Orders  ? POC COVID-19 (Completed)  ?  ? Other  ? Anxiety  ?  - Patient experiencing high levels of anxiety.  ?- Encouraged patient to engage in  relaxing activities like yoga, meditation, journaling, going for a walk, or participating in a hobby.  ?- Encouraged patient to reach out to trusted friends or family members about recent struggles, ?Patient was advi

## 2022-01-23 DIAGNOSIS — J301 Allergic rhinitis due to pollen: Secondary | ICD-10-CM | POA: Diagnosis not present

## 2022-01-23 DIAGNOSIS — H6123 Impacted cerumen, bilateral: Secondary | ICD-10-CM | POA: Diagnosis not present

## 2022-02-20 ENCOUNTER — Other Ambulatory Visit: Payer: Self-pay

## 2022-02-20 MED ORDER — PRAMIPEXOLE DIHYDROCHLORIDE 0.25 MG PO TABS
0.2500 mg | ORAL_TABLET | Freq: Three times a day (TID) | ORAL | 3 refills | Status: DC
Start: 2022-02-20 — End: 2022-06-23

## 2022-02-28 ENCOUNTER — Encounter: Payer: Self-pay | Admitting: Internal Medicine

## 2022-02-28 ENCOUNTER — Ambulatory Visit (INDEPENDENT_AMBULATORY_CARE_PROVIDER_SITE_OTHER): Payer: Medicare Other | Admitting: Internal Medicine

## 2022-02-28 VITALS — BP 99/68 | HR 83 | Ht 65.0 in | Wt 158.0 lb

## 2022-02-28 DIAGNOSIS — E8881 Metabolic syndrome: Secondary | ICD-10-CM | POA: Diagnosis not present

## 2022-02-28 DIAGNOSIS — I10 Essential (primary) hypertension: Secondary | ICD-10-CM

## 2022-02-28 DIAGNOSIS — I2510 Atherosclerotic heart disease of native coronary artery without angina pectoris: Secondary | ICD-10-CM | POA: Diagnosis not present

## 2022-02-28 DIAGNOSIS — F419 Anxiety disorder, unspecified: Secondary | ICD-10-CM

## 2022-02-28 MED ORDER — DIAZEPAM 10 MG PO TABS: ORAL_TABLET | ORAL | 0 refills | Status: DC

## 2022-02-28 MED ORDER — ZOLPIDEM TARTRATE 10 MG PO TABS: 10.0000 mg | ORAL_TABLET | Freq: Every evening | ORAL | 0 refills | Status: AC | PRN

## 2022-02-28 NOTE — Assessment & Plan Note (Signed)
-   Patient experiencing high levels of anxiety.  - Encouraged patient to engage in relaxing activities like yoga, meditation, journaling, going for a walk, or participating in a hobby.  - Encouraged patient to reach out to trusted friends or family members about recent struggles, Patient was advised to read A book, how to stop worrying and start living, it is good book to read to control  the stress  

## 2022-02-28 NOTE — Progress Notes (Signed)
Established Patient Office Visit  Subjective:  Patient ID: Carrie Vargas, female    DOB: 1945/10/15  Age: 76 y.o. MRN: [id number]  CC:  Chief Complaint  Patient presents with   Medication Refill    Medication Refill    Carrie Vargas presents for check up  Past Medical History:  Diagnosis Date   Basal cell carcinoma 09/25/2007   right pretibial    Past Surgical History:  Procedure Laterality Date   CHOLECYSTECTOMY      History reviewed. No pertinent family history.  Social History   Socioeconomic History   Marital status: Married    Spouse name: Not on file   Number of children: Not on file   Years of education: Not on file   Highest education level: 12th grade  Occupational History   Occupation: Retired  Tobacco Use   Smoking status: Never   Smokeless tobacco: Never  Vaping Use   Vaping Use: Never used  Substance and Sexual Activity   Alcohol use: Not Currently   Drug use: Never   Sexual activity: Yes  Other Topics Concern   Not on file  Social History Narrative   Not on file   Social Determinants of Health   Financial Resource Strain: Low Risk  (07/15/2021)   Overall Financial Resource Strain (CARDIA)    Difficulty of Paying Living Expenses: Not hard at all  Food Insecurity: No Food Insecurity (07/15/2021)   Hunger Vital Sign    Worried About Running Out of Food in the Last Year: Never true    [place] in the Last Year: Never true  Transportation Needs: No Transportation Needs (07/15/2021)   PRAPARE - Hydrologist (Medical): No    Lack of Transportation (Non-Medical): No  Physical Activity: Sufficiently Active (07/15/2021)   Exercise Vital Sign    Days of Exercise per Week: 5 days    Minutes of Exercise per Session: 120 min  Stress: No Stress Concern Present (07/15/2021)   [place]    Feeling of Stress : Not at all  Social Connections:  [place] (07/15/2021)   Social Connection and Isolation Panel [NHANES]    Frequency of Communication with Friends and Family: More than three times a week    Frequency of Social Gatherings with Friends and Family: More than three times a week    Attends Religious Services: More than 4 times per year    Active Member of [employer] or Organizations: Yes    Attends Music therapist: More than 4 times per year    Marital Status: Married  Human resources officer Violence: Not At Risk (07/15/2021)   Humiliation, Afraid, Rape, and Kick questionnaire    Fear of Current or Ex-Partner: No    Emotionally Abused: No    Physically Abused: No    Sexually Abused: No     Current Outpatient Medications:    albuterol (PROVENTIL) (2.5 MG/3ML) 0.083% nebulizer solution, Take 3 mLs (2.5 mg total) by nebulization every 6 (six) hours as needed for wheezing or shortness of breath., Disp: 75 mL, Rfl: 12   cetirizine (ZYRTEC) 10 MG tablet, TK 1 T PO BID, Disp: , Rfl:    fluticasone (FLONASE) 50 MCG/ACT nasal spray, Place 2 sprays into both nostrils daily., Disp: 16 g, Rfl: 6   omeprazole (PRILOSEC) 40 MG capsule, TAKE 1 CAPSULE(40 MG) BY MOUTH EVERY DAY, Disp: , Rfl:    pramipexole (MIRAPEX)  0.25 MG tablet, Take 1 tablet (0.25 mg total) by mouth 3 (three) times daily., Disp: 90 tablet, Rfl: 3   diazepam (VALIUM) 10 MG tablet, TAKE 1 TABLET(10 MG) BY MOUTH DAILY, Disp: 90 tablet, Rfl: 0   zolpidem (AMBIEN) 10 MG tablet, Take 1 tablet (10 mg total) by mouth at bedtime as needed. for sleep, Disp: 90 tablet, Rfl: 0   Allergies  Allergen Reactions   Omeprazole Magnesium Other (See Comments)    Makes joints ache really badly    ROS Review of Systems  Constitutional: Negative.   HENT: Negative.    Eyes: Negative.   Respiratory: Negative.    Cardiovascular: Negative.   Gastrointestinal: Negative.   Endocrine: Negative.   Genitourinary: Negative.   Musculoskeletal: Negative.   Skin: Negative.    Allergic/Immunologic: Negative.   Neurological: Negative.   Hematological: Negative.   Psychiatric/Behavioral: Negative.    All other systems reviewed and are negative.     Objective:    Physical Exam Vitals reviewed.  Constitutional:      Appearance: Normal appearance.  HENT:     Mouth/Throat:     Mouth: Mucous membranes are moist.  Eyes:     Pupils: Pupils are equal, round, and reactive to light.  Neck:     Vascular: No carotid bruit.  Cardiovascular:     Rate and Rhythm: Normal rate and regular rhythm.     Pulses: Normal pulses.     Heart sounds: Normal heart sounds.  Pulmonary:     Effort: Pulmonary effort is normal.     Breath sounds: Normal breath sounds.  Abdominal:     General: Bowel sounds are normal.     Palpations: Abdomen is soft. There is no hepatomegaly, splenomegaly or mass.     Tenderness: There is no abdominal tenderness.     Hernia: No hernia is present.  Musculoskeletal:        General: No tenderness.     Cervical back: Neck supple.     Right lower leg: No edema.     Left lower leg: No edema.  Skin:    Findings: No rash.  Neurological:     Mental Status: She is alert and oriented to person, place, and time.     Motor: No weakness.  Psychiatric:        Mood and Affect: Mood and affect normal.        Behavior: Behavior normal.     BP 99/68   Pulse 83   Ht $R'5\' 5"'fd$  (1.651 m)   Wt 158 lb (71.7 kg)   BMI 26.29 kg/m  Wt Readings from Last 3 Encounters:  02/28/22 158 lb (71.7 kg)  11/21/21 158 lb (71.7 kg)  09/14/21 160 lb (72.6 kg)     Health Maintenance Due  Topic Date Due   COVID-19 Vaccine (1) Never done   Hepatitis C Screening  Never done   Zoster Vaccines- Shingrix (1 of 2) Never done   DEXA SCAN  Never done   INFLUENZA VACCINE  02/28/2022    There are no preventive care reminders to display for this patient.  No results found for: "TSH" No results found for: "WBC", "HGB", "HCT", "MCV", "PLT" No results found for: "NA", "K",  "CHLORIDE", "CO2", "GLUCOSE", "BUN", "CREATININE", "BILITOT", "ALKPHOS", "AST", "ALT", "PROT", "ALBUMIN", "CALCIUM", "ANIONGAP", "EGFR", "GFR" No results found for: "CHOL" No results found for: "HDL" No results found for: "LDLCALC" No results found for: "TRIG" No results found for: "CHOLHDL" No results found for: "HGBA1C"  Assessment & Plan:   Problem List Items Addressed This Visit       Cardiovascular and Mediastinum   Coronary artery disease, non-occlusive    Hypercholesterolemia  I advised the patient to follow Mediterranean diet This diet is rich in fruits vegetables and whole grain, and This diet is also rich in fish and lean meat Patient should also eat a handful of almonds or walnuts daily Recent heart study indicated that average follow-up on this kind of diet reduces the cardiovascular mortality by 50 to 70%==      Essential hypertension - Primary     Patient denies any chest pain or shortness of breath there is no history of palpitation or paroxysmal nocturnal dyspnea   patient was advised to follow low-salt low-cholesterol diet    ideally I want to keep systolic blood pressure below 130 mmHg, patient was asked to check blood pressure one times a week and give me a report on that.  Patient will be follow-up in 3 months  or earlier as needed, patient will call me back for any change in the cardiovascular symptoms Patient was advised to buy a book from local bookstore concerning blood pressure and read several chapters  every day.  This will be supplemented by some of the material we will give him from the office.  Patient should also utilize other resources like YouTube and Internet to learn more about the blood pressure and the diet.        Other   Anxiety    - Patient experiencing high levels of anxiety.  - Encouraged patient to engage in relaxing activities like yoga, meditation, journaling, going for a walk, or participating in a hobby.  - Encouraged patient to  reach out to trusted friends or family members about recent struggles, Patient was advised to read A book, how to stop worrying and start living, it is good book to read to control  the stress       Relevant Medications   zolpidem (AMBIEN) 10 MG tablet   diazepam (VALIUM) 10 MG tablet   Metabolic syndrome    - I encouraged the patient to lose weight.  - I educated them on making healthy dietary choices including eating more fruits and vegetables and less fried foods. - I encouraged the patient to exercise more, and educated on the benefits of exercise including weight loss, diabetes prevention, and hypertension prevention.   Dietary counseling with a registered dietician  Referral to a weight management support group (e.g. Weight Watchers, Overeaters Anonymous)  If your BMI is greater than 29 or you have gained more than 15 pounds you should work on weight loss.  Attend a healthy cooking class        Meds ordered this encounter  Medications   zolpidem (AMBIEN) 10 MG tablet    Sig: Take 1 tablet (10 mg total) by mouth at bedtime as needed. for sleep    Dispense:  90 tablet    Refill:  0   diazepam (VALIUM) 10 MG tablet    Sig: TAKE 1 TABLET(10 MG) BY MOUTH DAILY    Dispense:  90 tablet    Refill:  0    Follow-up: No follow-ups on file.    Cletis Athens, MD

## 2022-02-28 NOTE — Assessment & Plan Note (Signed)

## 2022-02-28 NOTE — Assessment & Plan Note (Signed)

## 2022-02-28 NOTE — Assessment & Plan Note (Signed)
Hypercholesterolemia  I advised the patient to follow Mediterranean diet This diet is rich in fruits vegetables and whole grain, and This diet is also rich in fish and lean meat Patient should also eat a handful of almonds or walnuts daily Recent heart study indicated that average follow-up on this kind of diet reduces the cardiovascular mortality by 50 to 70%== 

## 2022-04-07 ENCOUNTER — Other Ambulatory Visit: Payer: Self-pay | Admitting: Orthopedic Surgery

## 2022-04-07 DIAGNOSIS — M8008XA Age-related osteoporosis with current pathological fracture, vertebra(e), initial encounter for fracture: Secondary | ICD-10-CM

## 2022-05-24 DIAGNOSIS — H6123 Impacted cerumen, bilateral: Secondary | ICD-10-CM | POA: Diagnosis not present

## 2022-05-24 DIAGNOSIS — H8112 Benign paroxysmal vertigo, left ear: Secondary | ICD-10-CM | POA: Diagnosis not present

## 2022-06-05 ENCOUNTER — Other Ambulatory Visit: Payer: Self-pay

## 2022-06-05 DIAGNOSIS — F419 Anxiety disorder, unspecified: Secondary | ICD-10-CM

## 2022-06-05 MED ORDER — DIAZEPAM 10 MG PO TABS: ORAL_TABLET | ORAL | 0 refills | Status: DC

## 2022-06-08 ENCOUNTER — Other Ambulatory Visit: Payer: Self-pay | Admitting: Internal Medicine

## 2022-06-08 DIAGNOSIS — F419 Anxiety disorder, unspecified: Secondary | ICD-10-CM

## 2022-06-19 ENCOUNTER — Other Ambulatory Visit: Payer: Self-pay | Admitting: Internal Medicine

## 2022-09-25 DIAGNOSIS — H6123 Impacted cerumen, bilateral: Secondary | ICD-10-CM | POA: Diagnosis not present

## 2022-09-25 DIAGNOSIS — J301 Allergic rhinitis due to pollen: Secondary | ICD-10-CM | POA: Diagnosis not present

## 2022-10-28 DIAGNOSIS — R92323 Mammographic fibroglandular density, bilateral breasts: Secondary | ICD-10-CM | POA: Diagnosis not present

## 2022-10-28 DIAGNOSIS — Z1231 Encounter for screening mammogram for malignant neoplasm of breast: Secondary | ICD-10-CM | POA: Diagnosis not present

## 2022-12-22 DIAGNOSIS — G2571 Drug induced akathisia: Secondary | ICD-10-CM | POA: Diagnosis not present

## 2022-12-22 DIAGNOSIS — M1710 Unilateral primary osteoarthritis, unspecified knee: Secondary | ICD-10-CM | POA: Diagnosis not present

## 2022-12-22 DIAGNOSIS — F419 Anxiety disorder, unspecified: Secondary | ICD-10-CM | POA: Diagnosis not present

## 2023-01-24 DIAGNOSIS — H6123 Impacted cerumen, bilateral: Secondary | ICD-10-CM | POA: Diagnosis not present

## 2023-01-24 DIAGNOSIS — J301 Allergic rhinitis due to pollen: Secondary | ICD-10-CM | POA: Diagnosis not present

## 2023-04-09 DIAGNOSIS — N390 Urinary tract infection, site not specified: Secondary | ICD-10-CM | POA: Diagnosis not present

## 2023-04-09 DIAGNOSIS — F419 Anxiety disorder, unspecified: Secondary | ICD-10-CM | POA: Diagnosis not present

## 2023-04-09 DIAGNOSIS — R350 Frequency of micturition: Secondary | ICD-10-CM | POA: Diagnosis not present

## 2023-04-09 DIAGNOSIS — G2571 Drug induced akathisia: Secondary | ICD-10-CM | POA: Diagnosis not present

## 2023-05-14 DIAGNOSIS — G2581 Restless legs syndrome: Secondary | ICD-10-CM | POA: Diagnosis not present

## 2023-05-14 DIAGNOSIS — Z136 Encounter for screening for cardiovascular disorders: Secondary | ICD-10-CM | POA: Diagnosis not present

## 2023-05-14 DIAGNOSIS — E611 Iron deficiency: Secondary | ICD-10-CM | POA: Diagnosis not present

## 2023-05-14 DIAGNOSIS — G4700 Insomnia, unspecified: Secondary | ICD-10-CM | POA: Diagnosis not present

## 2023-05-14 DIAGNOSIS — F419 Anxiety disorder, unspecified: Secondary | ICD-10-CM | POA: Diagnosis not present

## 2023-05-14 DIAGNOSIS — B028 Zoster with other complications: Secondary | ICD-10-CM | POA: Diagnosis not present

## 2023-05-14 DIAGNOSIS — R102 Pelvic and perineal pain: Secondary | ICD-10-CM | POA: Diagnosis not present

## 2023-05-14 DIAGNOSIS — Z131 Encounter for screening for diabetes mellitus: Secondary | ICD-10-CM | POA: Diagnosis not present

## 2023-05-14 DIAGNOSIS — Z13228 Encounter for screening for other metabolic disorders: Secondary | ICD-10-CM | POA: Diagnosis not present

## 2023-05-14 DIAGNOSIS — Z1331 Encounter for screening for depression: Secondary | ICD-10-CM | POA: Diagnosis not present

## 2023-05-14 DIAGNOSIS — Z130 Encounter for screening for diseases of the blood and blood-forming organs and certain disorders involving the immune mechanism: Secondary | ICD-10-CM | POA: Diagnosis not present

## 2023-05-21 DIAGNOSIS — H903 Sensorineural hearing loss, bilateral: Secondary | ICD-10-CM | POA: Diagnosis not present

## 2023-05-29 DIAGNOSIS — M4316 Spondylolisthesis, lumbar region: Secondary | ICD-10-CM | POA: Diagnosis not present

## 2023-05-29 DIAGNOSIS — M5432 Sciatica, left side: Secondary | ICD-10-CM | POA: Diagnosis not present

## 2023-05-29 DIAGNOSIS — M4726 Other spondylosis with radiculopathy, lumbar region: Secondary | ICD-10-CM | POA: Diagnosis not present

## 2023-05-29 DIAGNOSIS — G5702 Lesion of sciatic nerve, left lower limb: Secondary | ICD-10-CM | POA: Diagnosis not present

## 2023-06-08 DIAGNOSIS — G5702 Lesion of sciatic nerve, left lower limb: Secondary | ICD-10-CM | POA: Diagnosis not present

## 2023-06-08 DIAGNOSIS — M5432 Sciatica, left side: Secondary | ICD-10-CM | POA: Diagnosis not present

## 2023-06-08 DIAGNOSIS — R1032 Left lower quadrant pain: Secondary | ICD-10-CM | POA: Diagnosis not present

## 2023-06-08 DIAGNOSIS — F419 Anxiety disorder, unspecified: Secondary | ICD-10-CM | POA: Diagnosis not present

## 2023-06-20 DIAGNOSIS — R1032 Left lower quadrant pain: Secondary | ICD-10-CM | POA: Diagnosis not present

## 2023-06-25 DIAGNOSIS — M9901 Segmental and somatic dysfunction of cervical region: Secondary | ICD-10-CM | POA: Diagnosis not present

## 2023-06-25 DIAGNOSIS — M9903 Segmental and somatic dysfunction of lumbar region: Secondary | ICD-10-CM | POA: Diagnosis not present

## 2023-06-25 DIAGNOSIS — M9902 Segmental and somatic dysfunction of thoracic region: Secondary | ICD-10-CM | POA: Diagnosis not present

## 2023-06-25 DIAGNOSIS — M50323 Other cervical disc degeneration at C6-C7 level: Secondary | ICD-10-CM | POA: Diagnosis not present

## 2023-06-25 DIAGNOSIS — M62838 Other muscle spasm: Secondary | ICD-10-CM | POA: Diagnosis not present

## 2023-06-26 DIAGNOSIS — M9901 Segmental and somatic dysfunction of cervical region: Secondary | ICD-10-CM | POA: Diagnosis not present

## 2023-06-26 DIAGNOSIS — M62838 Other muscle spasm: Secondary | ICD-10-CM | POA: Diagnosis not present

## 2023-06-26 DIAGNOSIS — M9903 Segmental and somatic dysfunction of lumbar region: Secondary | ICD-10-CM | POA: Diagnosis not present

## 2023-06-26 DIAGNOSIS — M9902 Segmental and somatic dysfunction of thoracic region: Secondary | ICD-10-CM | POA: Diagnosis not present

## 2023-06-26 DIAGNOSIS — M50323 Other cervical disc degeneration at C6-C7 level: Secondary | ICD-10-CM | POA: Diagnosis not present

## 2023-07-02 DIAGNOSIS — M9902 Segmental and somatic dysfunction of thoracic region: Secondary | ICD-10-CM | POA: Diagnosis not present

## 2023-07-02 DIAGNOSIS — M50323 Other cervical disc degeneration at C6-C7 level: Secondary | ICD-10-CM | POA: Diagnosis not present

## 2023-07-02 DIAGNOSIS — M9901 Segmental and somatic dysfunction of cervical region: Secondary | ICD-10-CM | POA: Diagnosis not present

## 2023-07-02 DIAGNOSIS — M9903 Segmental and somatic dysfunction of lumbar region: Secondary | ICD-10-CM | POA: Diagnosis not present

## 2023-07-02 DIAGNOSIS — M62838 Other muscle spasm: Secondary | ICD-10-CM | POA: Diagnosis not present

## 2023-07-03 DIAGNOSIS — M50323 Other cervical disc degeneration at C6-C7 level: Secondary | ICD-10-CM | POA: Diagnosis not present

## 2023-07-03 DIAGNOSIS — M9901 Segmental and somatic dysfunction of cervical region: Secondary | ICD-10-CM | POA: Diagnosis not present

## 2023-07-03 DIAGNOSIS — M9902 Segmental and somatic dysfunction of thoracic region: Secondary | ICD-10-CM | POA: Diagnosis not present

## 2023-07-03 DIAGNOSIS — M9903 Segmental and somatic dysfunction of lumbar region: Secondary | ICD-10-CM | POA: Diagnosis not present

## 2023-07-03 DIAGNOSIS — M62838 Other muscle spasm: Secondary | ICD-10-CM | POA: Diagnosis not present

## 2023-07-09 DIAGNOSIS — Z01411 Encounter for gynecological examination (general) (routine) with abnormal findings: Secondary | ICD-10-CM | POA: Diagnosis not present

## 2023-07-09 DIAGNOSIS — M9901 Segmental and somatic dysfunction of cervical region: Secondary | ICD-10-CM | POA: Diagnosis not present

## 2023-07-09 DIAGNOSIS — M9903 Segmental and somatic dysfunction of lumbar region: Secondary | ICD-10-CM | POA: Diagnosis not present

## 2023-07-09 DIAGNOSIS — Z87440 Personal history of urinary (tract) infections: Secondary | ICD-10-CM | POA: Diagnosis not present

## 2023-07-09 DIAGNOSIS — N368 Other specified disorders of urethra: Secondary | ICD-10-CM | POA: Diagnosis not present

## 2023-07-09 DIAGNOSIS — M50323 Other cervical disc degeneration at C6-C7 level: Secondary | ICD-10-CM | POA: Diagnosis not present

## 2023-07-09 DIAGNOSIS — N888 Other specified noninflammatory disorders of cervix uteri: Secondary | ICD-10-CM | POA: Diagnosis not present

## 2023-07-09 DIAGNOSIS — I10 Essential (primary) hypertension: Secondary | ICD-10-CM | POA: Diagnosis not present

## 2023-07-09 DIAGNOSIS — Z1151 Encounter for screening for human papillomavirus (HPV): Secondary | ICD-10-CM | POA: Diagnosis not present

## 2023-07-09 DIAGNOSIS — M62838 Other muscle spasm: Secondary | ICD-10-CM | POA: Diagnosis not present

## 2023-07-09 DIAGNOSIS — M9902 Segmental and somatic dysfunction of thoracic region: Secondary | ICD-10-CM | POA: Diagnosis not present

## 2023-07-12 DIAGNOSIS — M9901 Segmental and somatic dysfunction of cervical region: Secondary | ICD-10-CM | POA: Diagnosis not present

## 2023-07-12 DIAGNOSIS — M50323 Other cervical disc degeneration at C6-C7 level: Secondary | ICD-10-CM | POA: Diagnosis not present

## 2023-07-12 DIAGNOSIS — M9902 Segmental and somatic dysfunction of thoracic region: Secondary | ICD-10-CM | POA: Diagnosis not present

## 2023-07-12 DIAGNOSIS — M62838 Other muscle spasm: Secondary | ICD-10-CM | POA: Diagnosis not present

## 2023-07-12 DIAGNOSIS — M9903 Segmental and somatic dysfunction of lumbar region: Secondary | ICD-10-CM | POA: Diagnosis not present

## 2023-07-16 DIAGNOSIS — M9902 Segmental and somatic dysfunction of thoracic region: Secondary | ICD-10-CM | POA: Diagnosis not present

## 2023-07-16 DIAGNOSIS — M9901 Segmental and somatic dysfunction of cervical region: Secondary | ICD-10-CM | POA: Diagnosis not present

## 2023-07-16 DIAGNOSIS — M50323 Other cervical disc degeneration at C6-C7 level: Secondary | ICD-10-CM | POA: Diagnosis not present

## 2023-07-16 DIAGNOSIS — M9903 Segmental and somatic dysfunction of lumbar region: Secondary | ICD-10-CM | POA: Diagnosis not present

## 2023-07-16 DIAGNOSIS — M62838 Other muscle spasm: Secondary | ICD-10-CM | POA: Diagnosis not present

## 2023-07-19 DIAGNOSIS — M50323 Other cervical disc degeneration at C6-C7 level: Secondary | ICD-10-CM | POA: Diagnosis not present

## 2023-07-19 DIAGNOSIS — M9902 Segmental and somatic dysfunction of thoracic region: Secondary | ICD-10-CM | POA: Diagnosis not present

## 2023-07-19 DIAGNOSIS — M9903 Segmental and somatic dysfunction of lumbar region: Secondary | ICD-10-CM | POA: Diagnosis not present

## 2023-07-19 DIAGNOSIS — M62838 Other muscle spasm: Secondary | ICD-10-CM | POA: Diagnosis not present

## 2023-07-19 DIAGNOSIS — M9901 Segmental and somatic dysfunction of cervical region: Secondary | ICD-10-CM | POA: Diagnosis not present

## 2023-07-21 ENCOUNTER — Other Ambulatory Visit: Payer: Self-pay | Admitting: Nurse Practitioner

## 2023-07-23 DIAGNOSIS — M9902 Segmental and somatic dysfunction of thoracic region: Secondary | ICD-10-CM | POA: Diagnosis not present

## 2023-07-23 DIAGNOSIS — M9901 Segmental and somatic dysfunction of cervical region: Secondary | ICD-10-CM | POA: Diagnosis not present

## 2023-07-23 DIAGNOSIS — M9903 Segmental and somatic dysfunction of lumbar region: Secondary | ICD-10-CM | POA: Diagnosis not present

## 2023-07-23 DIAGNOSIS — M50323 Other cervical disc degeneration at C6-C7 level: Secondary | ICD-10-CM | POA: Diagnosis not present

## 2023-07-23 DIAGNOSIS — M62838 Other muscle spasm: Secondary | ICD-10-CM | POA: Diagnosis not present

## 2023-07-30 DIAGNOSIS — M50323 Other cervical disc degeneration at C6-C7 level: Secondary | ICD-10-CM | POA: Diagnosis not present

## 2023-07-30 DIAGNOSIS — M62838 Other muscle spasm: Secondary | ICD-10-CM | POA: Diagnosis not present

## 2023-07-30 DIAGNOSIS — M9901 Segmental and somatic dysfunction of cervical region: Secondary | ICD-10-CM | POA: Diagnosis not present

## 2023-07-30 DIAGNOSIS — M9902 Segmental and somatic dysfunction of thoracic region: Secondary | ICD-10-CM | POA: Diagnosis not present

## 2023-07-30 DIAGNOSIS — M9903 Segmental and somatic dysfunction of lumbar region: Secondary | ICD-10-CM | POA: Diagnosis not present
